# Patient Record
Sex: Male | Born: 1955 | Race: Black or African American | Hispanic: No | Smoking: Former smoker
Health system: Southern US, Community
[De-identification: ages and names within clinical notes are randomized; demographics above are authoritative.]

## PROBLEM LIST (undated history)

## (undated) DIAGNOSIS — I10 Essential (primary) hypertension: Secondary | ICD-10-CM

## (undated) DIAGNOSIS — B1920 Unspecified viral hepatitis C without hepatic coma: Secondary | ICD-10-CM

## (undated) DIAGNOSIS — F1910 Other psychoactive substance abuse, uncomplicated: Secondary | ICD-10-CM

## (undated) HISTORY — PX: OTHER SURGICAL HISTORY: SHX169

## (undated) HISTORY — DX: Unspecified viral hepatitis C without hepatic coma: B19.20

---

## 2010-07-24 ENCOUNTER — Emergency Department (HOSPITAL_BASED_OUTPATIENT_CLINIC_OR_DEPARTMENT_OTHER): Admission: EM | Admit: 2010-07-24 | Discharge: 2010-07-24 | Payer: Self-pay | Admitting: Emergency Medicine

## 2010-11-26 LAB — POCT TOXICOLOGY PANEL

## 2010-11-26 LAB — ETHANOL: Alcohol, Ethyl (B): <10 mg/dL (ref 0–10)

## 2014-07-04 ENCOUNTER — Emergency Department (HOSPITAL_BASED_OUTPATIENT_CLINIC_OR_DEPARTMENT_OTHER)
Admission: EM | Admit: 2014-07-04 | Discharge: 2014-07-04 | Disposition: A | Discharge status: Home / Self Care | Payer: Self-pay | Attending: Emergency Medicine | Admitting: Emergency Medicine

## 2014-07-04 ENCOUNTER — Encounter (HOSPITAL_BASED_OUTPATIENT_CLINIC_OR_DEPARTMENT_OTHER): Payer: Self-pay | Admitting: Emergency Medicine

## 2014-07-04 DIAGNOSIS — S0006XA Insect bite (nonvenomous) of scalp, initial encounter: Secondary | ICD-10-CM | POA: Insufficient documentation

## 2014-07-04 DIAGNOSIS — Y939 Activity, unspecified: Secondary | ICD-10-CM | POA: Insufficient documentation

## 2014-07-04 DIAGNOSIS — Y9229 Other specified public building as the place of occurrence of the external cause: Secondary | ICD-10-CM | POA: Insufficient documentation

## 2014-07-04 DIAGNOSIS — S60561A Insect bite (nonvenomous) of right hand, initial encounter: Secondary | ICD-10-CM | POA: Insufficient documentation

## 2014-07-04 DIAGNOSIS — S1096XA Insect bite of unspecified part of neck, initial encounter: Secondary | ICD-10-CM | POA: Insufficient documentation

## 2014-07-04 DIAGNOSIS — R21 Rash and other nonspecific skin eruption: Secondary | ICD-10-CM

## 2014-07-04 DIAGNOSIS — S40862A Insect bite (nonvenomous) of left upper arm, initial encounter: Secondary | ICD-10-CM | POA: Insufficient documentation

## 2014-07-04 DIAGNOSIS — S60562A Insect bite (nonvenomous) of left hand, initial encounter: Secondary | ICD-10-CM | POA: Insufficient documentation

## 2014-07-04 DIAGNOSIS — W57XXXA Bitten or stung by nonvenomous insect and other nonvenomous arthropods, initial encounter: Secondary | ICD-10-CM | POA: Insufficient documentation

## 2014-07-04 DIAGNOSIS — S40861A Insect bite (nonvenomous) of right upper arm, initial encounter: Secondary | ICD-10-CM | POA: Insufficient documentation

## 2014-07-04 MED ORDER — HYDROXYZINE HCL 25 MG PO TABS: 25.0000 mg | ORAL_TABLET | Freq: Four times a day (QID) | ORAL | Status: DC

## 2014-07-04 MED ORDER — PERMETHRIN 5 % EX CREA: TOPICAL_CREAM | CUTANEOUS | Status: DC

## 2014-07-04 NOTE — Discharge Instructions (Signed)
Bedbugs °Bedbugs are tiny bugs that live in and around beds. During the day, they hide in mattresses and other places near beds. They come out at night and bite people lying in bed. They need blood to live and grow. Bedbugs can be found in beds anywhere. Usually, they are found in places where many people come and go (hotels, shelters, hospitals). It does not matter whether the place is dirty or clean. °Getting bitten by bedbugs rarely causes a medical problem. The biggest problem can be getting rid of them.  This often takes the work of a pest control expert. °CAUSES °· Less use of pesticides. Bedbugs were common before the 1950s. Then, strong pesticides such as DDT nearly wiped them out. Today, these pesticides are not used because they harm the environment and can cause health problems. °· More travel. Besides mattresses, bedbugs can also live in clothing and luggage. They can come along as people travel from place to place. Bedbugs are more common in certain parts of the world. When people travel to those areas, the bugs can come home with them. °· Presence of birds and bats. Bedbugs often infest birds and bats. If you have these animals in or near your home, bedbugs may infest your house, too. °SYMPTOMS °It does not hurt to be bitten by a bedbug. You will probably not wake up when you are bitten. Bedbugs usually bite areas of the skin that are not covered. Symptoms may show when you wake up, or they may take a day or more to show up. Symptoms may include: °· Small red bumps on the skin. These might be lined up in a row or clustered in a group. °· A darker red dot in the middle of red bumps. °· Blisters on the skin. There may be swelling and very bad itching. These may be signs of an allergic reaction. This does not happen often. °DIAGNOSIS °Bedbug bites might look and feel like other types of insect bites. The bugs do not stay on the body like ticks or lice. They bite, drop off, and crawl away to hide. Your  caregiver will probably: °· Ask about your symptoms. °· Ask about your recent activities and travel. °· Check your skin for bedbug bites. °· Ask you to check at home for signs of bedbugs. You should look for: °¨ Spots or stains on the bed or nearby. This could be from bedbugs that were crushed or from their eggs or waste. °¨ Bedbugs themselves. They are reddish-brown, oval, and flat. They do not fly. They are about the size of an apple seed. °· Places to look for bedbugs include: °¨ Beds. Check mattresses, headboards, box springs, and bed frames. °¨ On drapes and curtains near the bed. °¨ Under carpeting in the bedroom. °¨ Behind electrical outlets. °¨ Behind any wallpaper that is peeling. °¨ Inside luggage. °TREATMENT °Most bedbug bites do not need treatment. They usually go away on their own in a few days. The bites are not dangerous. However, treatment may be needed if you have scratched so much that your skin has become infected. You may also need treatment if you are allergic to bedbug bites. Treatment options include: °· A drug that stops swelling and itching (corticosteroid). Usually, a cream is rubbed on the skin. If you have a bad rash, you may be given a corticosteroid pill. °· Oral antihistamines. These are pills to help control itching. °· Antibiotic medicines. An antibiotic may be prescribed for infected skin. °HOME CARE INSTRUCTIONS  °·   Take any medicine prescribed by your caregiver for your bites. Follow the directions carefully.  Consider wearing pajamas with long sleeves and pant legs.  Your bedroom may need to be treated. A pest control expert should make sure the bedbugs are gone. You may need to throw away mattresses or luggage. Ask the pest control expert what you can do to keep the bedbugs from coming back. Common suggestions include:  Putting a plastic cover over your mattress.  Washing and drying your clothes and bedding in hot water and a hot dryer. The temperature should be hotter  than 120 F (48.9 C). Bedbugs are killed by high temperatures.  Vacuuming carefully all around your bed. Vacuum in all cracks and crevices where the bugs might hide. Do this often.  Carefully checking all used furniture, bedding, or clothes that you bring into your house.  Eliminating bird nests and bat roosts.  If you get bedbug bites when traveling, check all your possessions carefully before bringing them into your house. If you find any bugs on clothes or in your luggage, consider throwing those items away. SEEK MEDICAL CARE IF:  You have red bug bites that keep coming back.  You have red bug bites that itch badly.  You have bug bites that cause a skin rash.  You have scratch marks that are red and sore. SEEK IMMEDIATE MEDICAL CARE IF: You have a fever. Document Released: 10/04/2010 Document Revised: 11/24/2011 Document Reviewed: 10/04/2010 [facility] Patient Information 2015 [employer], [place]. This information is not intended to replace advice given to you by your health care provider. Make sure you discuss any questions you have with your health care provider.   Emergency Department Resource Guide 1) Find a Doctor and Pay Out of Pocket Although you won't have to find out who is covered by your insurance plan, it is a good idea to ask around and get recommendations. You will then need to call the office and see if the doctor you have chosen will accept you as a new patient and what types of options they offer for patients who are self-pay. Some doctors offer discounts or will set up payment plans for their patients who do not have insurance, but you will need to ask so you aren't surprised when you get to your appointment.  2) Contact Your Local Health Department Not all health departments have doctors that can see patients for sick visits, but many do, so it is worth a call to see if yours does. If you don't know where your local health department is, you can check in your phone book.  The CDC also has a tool to help you locate your state's health department, and many state websites also have listings of all of their local health departments.  3) Find a Walk-in Clinic If your illness is not likely to be very severe or complicated, you may want to try a walk in clinic. These are popping up all over the country in pharmacies, drugstores, and shopping centers. They're usually staffed by nurse practitioners or physician assistants that have been trained to treat common illnesses and complaints. They're usually fairly quick and inexpensive. However, if you have serious medical issues or chronic medical problems, these are probably not your best option.  No Primary Care Doctor: - Call Health Connect at  [phone] - they can help you locate a primary care doctor that  accepts your insurance, provides certain services, etc. - Physician Referral Service- [phone]  Chronic Pain Problems: Organization  Address  Phone   Notes  [facility]  [phone] Patients need to be referred by their primary care doctor.   Medication Assistance: Organization         Address  Phone   Notes  [facility] Medication [facility] [address] [place], [place] [address] [phone] --Must be a resident of [facility] -- Must have NO insurance coverage whatsoever (no Medicaid/ Medicare, etc.) -- The pt. MUST have a primary care doctor that directs their care regularly and follows them in the community   MedAssist  [phone]) [phone]   [employer]  [phone]    Agencies that provide inexpensive medical care: Organization         Address  Phone   Notes  [facility]  [phone]   [facility]    [phone]   [facility] [address] [place], [place] [address] [phone]   [facility] [address], [place] [phone]   [facility]     [phone]) [phone]   [facility]    [phone]   [facility] and [facility]  [address], [place] Phone:  [phone], Fax:  [phone] Hours of Operation:  9 am - 6 pm, M-F.  Also accepts Medicaid/Medicare and self-pay.  [facility]  [address], [place] Phone: [phone]) [phone], Fax: [phone]. Hours of Operation:  8:30 am - 5:30 pm, M-F.  Also accepts Medicaid and self-pay.  [facility] [place] [address], [place] Phone: [phone]) [phone]   [facility] [address], [place], [place] [phone], Ext. [phone] Mondays & Thursdays: 7-9 AM.  First 15 patients are seen on a first come, first serve basis.    Medicaid-accepting [facility] [place] Providers:  Organization         Address  Phone   Notes  [facility] [address], [place] [phone] Also accepts self-pay patients.  [facility] [address], [place]  [phone]) [phone]   [facility] [address], [place] [phone]   [facility] [address] [place] [address], [place] [phone]   Renaye RakersVeita Bland [address], [place]   [phone] Only accepts [place] Access [place] patients after they have their name applied to their card.   Self-Pay (no insurance) in [facility]:  Organization         Address  Phone   Notes  Sickle Cell Patients, [facility] [address], [place] [phone]) [phone]   [facility] [address], [place] [phone]   [facility] [place]  [address], [place] [phone]) [phone]   [facility]/Dr. Osei-Bonsu  [address] [place] [address], [place] or [phone], [place] [phone] Phone number for both [place] and [place] locations is the same.  [facility] and  [facility] [address], [place] [phone]   [facility] [place] [address] [place] [address], [place] or [address] [phone]   [facility] [address], [place] [phone], phone; [phone], fax Sees patients 1st and 3rd Saturday of every month.  Must not qualify  for public or private insurance (i.e. Medicaid, Medicare, [place] Health Choice, Veterans' Benefits)  Household income should be no more than 200% of the poverty level The clinic cannot treat you if you are pregnant or think you are pregnant  Sexually transmitted diseases are not treated at the clinic.    Dental Care: Organization         Address  Phone  Notes  [facility] of [facility] [address] [place], [place] [phone] Accepts children up to age 35 who are enrolled in [place] or [place] Health Choice; pregnant women with a Medicaid card; and children who have applied for Medicaid or [place] Health Choice, but were declined, whose parents can pay a reduced fee at time of service.  [facility] Department of [facility]  [address], [place] [phone] Accepts children up to age 74 who are enrolled in [place] or [place] Health Choice; pregnant women with a Medicaid card; and children who have applied for Medicaid or [place] Health Choice, but were declined, whose parents can pay a reduced fee at time of service.  [facility]  [address] [place], [place] [phone] Patients are seen by appointment only. Walk-ins are not accepted. [facility] will see patients 36 years of age and older. Monday - Tuesday (8am-5pm) Most Wednesdays (8:30-5pm) $30 per visit, cash only  [facility] Access PRO[facility]  [address], [facility] [phone] Patients are seen by appointment only. Walk-ins are not accepted. [facility] will see patients 31 years of age and  older. One Wednesday Evening (Monthly: Volunteer Based).  $30 per visit, cash only  [employer]  [phone] for adults; Children under age 38, call Graduate Pediatric Dentistry at [phone]. Children aged 87-14, please call [phone] to request a pediatric application.  Dental services are provided in all areas of dental care including fillings, crowns and bridges, complete and partial dentures, implants, gum treatment, root canals, and extractions. Preventive care is also provided. Treatment is provided to both adults and children. Patients are selected via a lottery and there is often a waiting list.   [facility] [address], [place]  [phone] [email or web address]   [facility] [address] [place], [place] [phone], Ext. [phone] Second and Fourth Thursday of each month, opens at 6:30 AM; Clinic ends at 9 AM.  Patients are seen on a first-come first-served basis, and a limited number are seen during each clinic.   [facility]  [address] [place] [address] [place], [place] [phone]   Eligibility Requirements You must have lived in [place], [place], or [place] counties for at least the last three months.   You cannot be eligible for state or federal sponsored [place], including [employer], [place], or [employer].   You generally cannot be eligible for healthcare insurance through your employer.    How to apply: Eligibility screenings are held every Tuesday and Wednesday afternoon from 1:00 pm until 4:00 pm. You do not need an appointment for the interview!  [facility] [address] [place], [place] [phone]   [facility] Health Department  [phone]   [facility] Health Department  [phone]   [facility] Health Department  [phone]    Behavioral Health Resources in the Community: Intensive Outpatient Programs Organization          Address  Phone  Notes  [facility] [address] [place], [place] [phone]   [facility]patient [address] [place], [place] [phone]   [facility]: [facility] [address] [place], [place]  [phone]   [facility] Mental Health [address]  [place], [place] [phone] or [phone]   Substance Abuse Resources Organization         Address  Phone  Notes  Alcohol and Drug Services  [phone]   [facility]  [phone]   [facility] [place]  [phone]   Floydene Flock  [phone]   Residential & Outpatient Substance Abuse Program  [phone]   Psychological Services Organization         Address  Phone  Notes  [facility] Behavioral Health  [phone]   [facility]  [phone]   [facility] [address], [place] [phone] or [phone]    Mobile Crisis Teams Organization         Address  Phone  Notes  [facility], Mobile Crisis Care Unit  [phone]   [facility]  [address] [place], [place] [phone]   Doristine Locks [address] [place] [phone]    Self-Help/Support Groups Organization         Address  Phone             Notes  [facility] [place] - variety of support groups  [phone]- [phone] Call for more information  Narcotics Anonymous (NA), [facility] [address], [employer] [place]  2 meetings at this location   Statistician         Address  Phone  Notes  [facility] [address],    [place]  [phone]   [facility]  [address] [place] [address]242, [place], [place] [phone]   [facility] [address] [place], [place] [phone] Admissions: 8am-3pm M-F  [facility] [address]    [place], [place] [phone]   [facility] [address] [place], [place], [place] [phone]   [facility] [address],  [place], [place] [phone]   [facility] - Intensive Outpatient [address], [place], [place] [phone]   [facility] [address] [place]  [place], [place] [phone] or [phone]   [facility] ([facility]) [address], [place], [place] [phone] Accepts Medicaid  [facility] [place] [address]  [place] [phone] Substance Abuse/Addiction Treatment   [facility] Organization         Address  Phone  Notes  [facility]  [phone]   Angie Fava, PhD [address] [place], [place]   [phone] or [phone]   [facility] Behavioral   [address] [place], [place] [phone]   [facility] [address] [place], [place] [phone] Insurance/Medicaid/sponsorship through [facility] and Families [address]                                    [place], [place] [phone] Therapy/tele-psych/case  [facility] [address], [place] [phone]    Dr. Lolly Mustache  [phone]   [facility]. 1) [address], [place] 2) [place] 3)  [place] [address], [place] [phone]  [phone]   [place] [phone] or [phone] (After Hours)

## 2014-07-04 NOTE — ED Provider Notes (Signed)
CSN: [id number]     Arrival date & time 07/04/14  1411 History   First MD Initiated Contact with Patient 07/04/14 1501     Chief Complaint  Patient presents with  . Rash     (Consider location/radiation/quality/duration/timing/severity/associated sxs/prior Treatment) Patient is a 58 y.o. male presenting with rash. The history is provided by the patient.  Rash Location:  Shoulder/arm, head/neck, torso and ano-genital Head/neck rash location:  L neck, R neck and scalp Shoulder/arm rash location:  L upper arm, R upper arm, R arm, L arm, L forearm, R forearm, R hand and L hand Torso rash location:  Upper back and lower back Ano-genital rash location:  Groin, L buttock and R buttock Quality: itchiness   Quality: not bruising, not burning, not dry, not painful, not scaling, not swelling and not weeping   Severity:  Moderate Onset quality:  Gradual Duration:  3 days Timing:  Constant Progression:  Unchanged Chronicity:  New Context: insect bite/sting (found bedbugs at a hotel where he stayed)   Relieved by:  Nothing Worsened by:  Nothing tried Associated symptoms: no abdominal pain, no diarrhea, no fatigue, no fever, no nausea, no shortness of breath and not vomiting     History reviewed. No pertinent past medical history. History reviewed. No pertinent past surgical history. History reviewed. No pertinent family history. History  Substance Use Topics  . Smoking status: Never Smoker   . Smokeless tobacco: Not on file  . Alcohol Use: No    Review of Systems  Constitutional: Negative for fever, chills and fatigue.  Respiratory: Negative for cough and shortness of breath.   Gastrointestinal: Negative for nausea, vomiting, abdominal pain and diarrhea.  Skin: Positive for rash.  All other systems reviewed and are negative.     Allergies  Review of patient's allergies indicates no known allergies.  Home Medications   Prior to Admission medications   Not on File   BP  153/88  Pulse 80  Temp(Src) 98.2 F (36.8 C) (Oral)  Resp 16  Ht 5\' 10"  (1.778 m)  Wt 215 lb (97.523 kg)  BMI 30.85 kg/m2  SpO2 100% Physical Exam  Constitutional: He is oriented to person, place, and time. He appears well-developed and well-nourished. No distress.  HENT:  Head: Normocephalic and atraumatic.  Mouth/Throat: No oropharyngeal exudate.  Eyes: EOM are normal. Pupils are equal, round, and reactive to light.  Neck: Normal range of motion. Neck supple.  Cardiovascular: Normal rate and regular rhythm.  Exam reveals no friction rub.   No murmur heard. Pulmonary/Chest: Effort normal and breath sounds normal. No respiratory distress. He has no wheezes. He has no rales.  Abdominal: He exhibits no distension. There is no tenderness. There is no rebound.  Musculoskeletal: Normal range of motion. He exhibits no edema.  Neurological: He is alert and oriented to person, place, and time.  Skin: Rash (multiple lesions on arms, hands, neck, on fingers. Red, no papules. Many of these lesions are excoriated and scabbed over. No signs of cellulitis or purpura. No petechiae.) noted. He is not diaphoretic.    ED Course  Procedures (including critical care time) Labs Review Labs Reviewed - No data to display  Imaging Review No results found.   EKG Interpretation None      MDM   Final diagnoses:  Bedbug bite  Rash    68M here with rash. States it's bed bugs, took a picture with his phone at a hotel where he stayed 3 days ago. Rash pruritic.  No cellulitis, no purpura or petechiae. Denies systemic symptoms. AFVSS here. Rash on forearms, a few lesions on hands - red, no major papules. Patient absolutely sure it's from bedbugs. Will give atarax for itching and will give permethrin to treat for possible scabies.    Elwin MochaBlair Rushi Chasen, MD 07/04/14 [phone]

## 2014-07-04 NOTE — ED Notes (Signed)
Pt c/o rash to entire body " bed bugs" x 3 days

## 2015-09-24 ENCOUNTER — Emergency Department (HOSPITAL_BASED_OUTPATIENT_CLINIC_OR_DEPARTMENT_OTHER)
Admission: EM | Admit: 2015-09-24 | Discharge: 2015-09-24 | Disposition: A | Discharge status: Home / Self Care | Payer: No Typology Code available for payment source | Attending: Emergency Medicine | Admitting: Emergency Medicine

## 2015-09-24 ENCOUNTER — Encounter (HOSPITAL_BASED_OUTPATIENT_CLINIC_OR_DEPARTMENT_OTHER): Payer: Self-pay | Admitting: Emergency Medicine

## 2015-09-24 ENCOUNTER — Emergency Department (HOSPITAL_BASED_OUTPATIENT_CLINIC_OR_DEPARTMENT_OTHER): Payer: No Typology Code available for payment source

## 2015-09-24 DIAGNOSIS — S29001A Unspecified injury of muscle and tendon of front wall of thorax, initial encounter: Secondary | ICD-10-CM | POA: Insufficient documentation

## 2015-09-24 DIAGNOSIS — S0990XA Unspecified injury of head, initial encounter: Secondary | ICD-10-CM | POA: Diagnosis not present

## 2015-09-24 DIAGNOSIS — Y92410 Unspecified street and highway as the place of occurrence of the external cause: Secondary | ICD-10-CM | POA: Insufficient documentation

## 2015-09-24 DIAGNOSIS — Y9389 Activity, other specified: Secondary | ICD-10-CM | POA: Insufficient documentation

## 2015-09-24 DIAGNOSIS — S81802A Unspecified open wound, left lower leg, initial encounter: Secondary | ICD-10-CM | POA: Diagnosis not present

## 2015-09-24 DIAGNOSIS — Y998 Other external cause status: Secondary | ICD-10-CM | POA: Insufficient documentation

## 2015-09-24 DIAGNOSIS — M79662 Pain in left lower leg: Secondary | ICD-10-CM

## 2015-09-24 DIAGNOSIS — T148XXA Other injury of unspecified body region, initial encounter: Secondary | ICD-10-CM

## 2015-09-24 DIAGNOSIS — R0781 Pleurodynia: Secondary | ICD-10-CM

## 2015-09-24 MED ORDER — BACITRACIN ZINC 500 UNIT/GM EX OINT
TOPICAL_OINTMENT | Freq: Two times a day (BID) | CUTANEOUS | Status: DC
  Filled 2015-09-24: qty 28.35

## 2015-09-24 MED ORDER — IBUPROFEN 800 MG PO TABS: 800.0000 mg | ORAL_TABLET | Freq: Three times a day (TID) | ORAL | Status: DC

## 2015-09-24 MED ORDER — IBUPROFEN 800 MG PO TABS
800.0000 mg | ORAL_TABLET | Freq: Once | ORAL | Status: AC
  Administered 2015-09-24: 800 mg via ORAL
  Filled 2015-09-24: qty 1
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
## 2015-09-24 NOTE — ED Notes (Signed)
Pt refused to have wound cleaned and dressed. Sts he is in a hurry and frustrated and he will do it at home.
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
## 2019-04-19 ENCOUNTER — Other Ambulatory Visit: Payer: Self-pay

## 2019-04-19 ENCOUNTER — Emergency Department (HOSPITAL_BASED_OUTPATIENT_CLINIC_OR_DEPARTMENT_OTHER): Payer: BLUE CROSS/BLUE SHIELD

## 2019-04-19 ENCOUNTER — Emergency Department (HOSPITAL_BASED_OUTPATIENT_CLINIC_OR_DEPARTMENT_OTHER)
Admission: EM | Admit: 2019-04-19 | Discharge: 2019-04-19 | Disposition: A | Discharge status: Home / Self Care | Payer: BLUE CROSS/BLUE SHIELD | Attending: Emergency Medicine | Admitting: Emergency Medicine

## 2019-04-19 ENCOUNTER — Encounter (HOSPITAL_BASED_OUTPATIENT_CLINIC_OR_DEPARTMENT_OTHER): Payer: Self-pay | Admitting: Emergency Medicine

## 2019-04-19 DIAGNOSIS — Z79899 Other long term (current) drug therapy: Secondary | ICD-10-CM | POA: Diagnosis not present

## 2019-04-19 DIAGNOSIS — I10 Essential (primary) hypertension: Secondary | ICD-10-CM | POA: Diagnosis not present

## 2019-04-19 DIAGNOSIS — R0789 Other chest pain: Secondary | ICD-10-CM | POA: Insufficient documentation

## 2019-04-19 DIAGNOSIS — R079 Chest pain, unspecified: Secondary | ICD-10-CM | POA: Diagnosis present

## 2019-04-19 HISTORY — DX: Other psychoactive substance abuse, uncomplicated: F19.10

## 2019-04-19 HISTORY — DX: Essential (primary) hypertension: I10

## 2019-04-19 LAB — BASIC METABOLIC PANEL
Anion gap: 8 (ref 5–15)
BUN: 12 mg/dL (ref 6–20)
CO2: 26 mmol/L (ref 22–32)
Calcium: 8.7 mg/dL — ABNORMAL LOW (ref 8.9–10.3)
Chloride: 102 mmol/L (ref 98–111)
Creatinine, Ser: 0.85 mg/dL (ref 0.61–1.24)
GFR calc Af Amer: >60 mL/min (ref >60)
GFR calc non Af Amer: >60 mL/min (ref >60)
Glucose, Bld: 108 mg/dL — ABNORMAL HIGH (ref 70–99)
Potassium: 4.1 mmol/L (ref 3.5–5.1)
Sodium: 136 mmol/L (ref 135–145)

## 2019-04-19 LAB — CBC WITH DIFFERENTIAL/PLATELET
Abs Immature Granulocytes: 0.02 10*3/uL (ref 0.00–0.07)
Basophils Absolute: 0 10*3/uL (ref 0.0–0.1)
Basophils Relative: 0 %
Eosinophils Absolute: 0.3 10*3/uL (ref 0.0–0.5)
Eosinophils Relative: 4 %
HCT: 36.4 % — ABNORMAL LOW (ref 39.0–52.0)
Hemoglobin: 11.5 g/dL — ABNORMAL LOW (ref 13.0–17.0)
Immature Granulocytes: 0 %
Lymphocytes Relative: 32 %
Lymphs Abs: 2 10*3/uL (ref 0.7–4.0)
MCH: 27.5 pg (ref 26.0–34.0)
MCHC: 31.6 g/dL (ref 30.0–36.0)
MCV: 87.1 fL (ref 80.0–100.0)
Monocytes Absolute: 0.6 10*3/uL (ref 0.1–1.0)
Monocytes Relative: 9 %
Neutro Abs: 3.4 10*3/uL (ref 1.7–7.7)
Neutrophils Relative %: 55 %
Platelets: 215 10*3/uL (ref 150–400)
RBC: 4.18 MIL/uL — ABNORMAL LOW (ref 4.22–5.81)
RDW: 13.3 % (ref 11.5–15.5)
WBC: 6.3 10*3/uL (ref 4.0–10.5)
nRBC: 0 % (ref 0.0–0.2)

## 2019-04-19 LAB — TROPONIN I (HIGH SENSITIVITY): Troponin I (High Sensitivity): <2 ng/L (ref <18)

## 2019-04-19 NOTE — ED Triage Notes (Addendum)
"   I was having a little sharp pain in my chest about a hr ago" Denies chest pain or SOB at present

## 2019-04-19 NOTE — Discharge Instructions (Signed)
Please return for worsening symptoms, symptoms that happen when you are exerting yourself.  Please follow-up with the family physician.

## 2019-04-19 NOTE — ED Provider Notes (Signed)
[facility] EMERGENCY DEPARTMENT Provider Note   CSN: [id number] Arrival date & time: 04/19/19  0844    History   Chief Complaint Chief Complaint  Patient presents with  . Chest Pain    HPI Rory PercyJoseph Frye is a 63 y.o. male.     63 yo M with a chief complaint of left-sided chest pain.  This was sharp.  Pinpoint.  Lasted for less than a second.  Occurred while he was at work.  Nonexertional.  No shortness of breath no diaphoresis no nausea or vomiting.  No history of MI.  History of hypertension that improved with diet and exercise.  Denies hyperlipidemia diabetes smoking or family history of MI.  No history of PE or DVT no unilateral lower extremity edema no hemoptysis no recent surgery immobilization or travel.  The history is provided by the patient.  Chest Pain Pain location:  L chest Pain quality: sharp   Pain radiates to:  Does not radiate Pain severity:  Moderate Onset quality:  Sudden Duration: seconds. Timing:  Rare Progression:  Resolved Chronicity:  New Relieved by:  Nothing Worsened by:  Nothing Ineffective treatments:  None tried Associated symptoms: no abdominal pain, no fever, no headache, no palpitations, no shortness of breath and no vomiting     Past Medical History:  Diagnosis Date  . Drug abuse (HCC)   . Hypertension     There are no active problems to display for this patient.   History reviewed. No pertinent surgical history.      Home Medications    Prior to Admission medications   Medication Sig Start Date End Date Taking? Authorizing Provider  METHADONE HCL PO Take 40 mg by mouth 1 day or 1 dose.   Yes [provider]  hydrOXYzine (ATARAX/VISTARIL) 25 MG tablet Take 1 tablet (25 mg total) by mouth every 6 (six) hours. 07/04/14   Elwin MochaWalden, Blair, MD  ibuprofen (ADVIL,MOTRIN) 800 MG tablet Take 1 tablet (800 mg total) by mouth 3 (three) times daily. 09/24/15   Cheri Fowlerose, Kayla, PA-C  permethrin (ELIMITE) 5 % cream Apply to  affected area once, rinse off after 6 hours. Repeat in 5-7 days if necessary 07/04/14   Elwin MochaWalden, Blair, MD    Family History No family history on file.  Social History Social History   Tobacco Use  . Smoking status: Never Smoker  . Smokeless tobacco: Never Used  Substance Use Topics  . Alcohol use: No  . Drug use: Yes    Comment: Opioids      Allergies   Patient has no known allergies.   Review of Systems Review of Systems  Constitutional: Negative for chills and fever.  HENT: Negative for congestion and facial swelling.   Eyes: Negative for discharge and visual disturbance.  Respiratory: Negative for shortness of breath.   Cardiovascular: Positive for chest pain. Negative for palpitations.  Gastrointestinal: Negative for abdominal pain, diarrhea and vomiting.  Musculoskeletal: Negative for arthralgias and myalgias.  Skin: Negative for color change and rash.  Neurological: Negative for tremors, syncope and headaches.  Psychiatric/Behavioral: Negative for confusion and dysphoric mood.     Physical Exam Updated Vital Signs BP 133/81   Pulse (!) 59   Temp 98 F (36.7 C) (Oral)   Resp 11   Ht 5\' 11"  (1.803 m)   Wt 96 kg   SpO2 99%   BMI 29.51 kg/m   Physical Exam Vitals signs and nursing note reviewed.  Constitutional:      Appearance:  He is well-developed.  HENT:     Head: Normocephalic and atraumatic.  Eyes:     Pupils: Pupils are equal, round, and reactive to light.  Neck:     Musculoskeletal: Normal range of motion and neck supple.     Vascular: No JVD.  Cardiovascular:     Rate and Rhythm: Normal rate and regular rhythm.     Heart sounds: No murmur. No friction rub. No gallop.   Pulmonary:     Effort: No respiratory distress.     Breath sounds: No wheezing.  Chest:     Chest wall: No mass or tenderness.  Abdominal:     General: There is no distension.     Tenderness: There is no guarding or rebound.  Musculoskeletal: Normal range of motion.   Skin:    Coloration: Skin is not pale.     Findings: No rash.     Comments: Loss of hair to lower legs   Neurological:     Mental Status: He is alert and oriented to person, place, and time.  Psychiatric:        Behavior: Behavior normal.      ED Treatments / Results  Labs (all labs ordered are listed, but only abnormal results are displayed) Labs Reviewed  CBC WITH DIFFERENTIAL/PLATELET - Abnormal; Notable for the following components:      Result Value   RBC 4.18 (*)    Hemoglobin 11.5 (*)    HCT 36.4 (*)    All other components within normal limits  BASIC METABOLIC PANEL - Abnormal; Notable for the following components:   Glucose, Bld 108 (*)    Calcium 8.7 (*)    All other components within normal limits  TROPONIN I (HIGH SENSITIVITY)  TROPONIN I (HIGH SENSITIVITY)    EKG EKG Interpretation  Date/Time:  Tuesday April 19 2019 08:53:41 EDT Ventricular Rate:  77 PR Interval:    QRS Duration: 84 QT Interval:  383 QTC Calculation: 434 R Axis:   52 Text Interpretation:  Sinus rhythm Abnormal R-wave progression, early transition No old tracing to compare Confirmed by Deno Etienne [phone]) on 04/19/2019 9:55:50 AM   Radiology Dg Chest 2 View  Result Date: 04/19/2019 CLINICAL DATA:  63 year old male with a history of chest pain EXAM: CHEST - 2 VIEW COMPARISON:  December 29, 2014 FINDINGS: Cardiomediastinal silhouette unchanged in size and contour. No evidence of central vascular congestion. No interlobular septal thickening. Coarsened interstitial markings similar to prior. No pneumothorax or pleural effusion. No confluent airspace disease. No displaced fracture. IMPRESSION: Chronic lung changes without evidence of acute cardiopulmonary disease Electronically Signed   By: Corrie Mckusick D.O.   On: 04/19/2019 09:21    Procedures Procedures (including critical care time)  Medications Ordered in ED Medications - No data to display   Initial Impression / Assessment and Plan /  ED Course  I have reviewed the triage vital signs and the nursing notes.  Pertinent labs & imaging results that were available during my care of the patient were reviewed by me and considered in my medical decision making (see chart for details).        63 yo M with a chief complaints of left-sided chest pain.  Atypical of ACS.  Lasted seconds pinpoint resolved.  Nonexertional.  No cardiac risk factors.  Discussed obtaining a delta troponin with the patient.  He is currently refusing.  Would prefer to have a single test.  Discussed limitations of this.  He will follow-up with  his family doctor.  10:34 AM:  I have discussed the diagnosis/risks/treatment options with the patient and family and believe the pt to be eligible for discharge home to follow-up with PCP. We also discussed returning to the ED immediately if new or worsening sx occur. We discussed the sx which are most concerning (e.g., sudden worsening pain, fever, inability to tolerate by mouth) that necessitate immediate return. Medications administered to the patient during their visit and any new prescriptions provided to the patient are listed below.  Medications given during this visit Medications - No data to display   The patient appears reasonably screen and/or stabilized for discharge and I doubt any other medical condition or other [facility] requiring further screening, evaluation, or treatment in the ED at this time prior to discharge.    Final Clinical Impressions(s) / ED Diagnoses   Final diagnoses:  Atypical chest pain    ED Discharge Orders    None       Melene PlanFloyd, Myrian Botello, DO 04/19/19 1034

## 2019-04-19 NOTE — ED Notes (Signed)
Patient transported to X-ray 

## 2019-04-19 NOTE — ED Notes (Signed)
ED Provider at bedside. 

## 2022-01-20 ENCOUNTER — Encounter (HOSPITAL_BASED_OUTPATIENT_CLINIC_OR_DEPARTMENT_OTHER): Payer: Self-pay

## 2022-01-20 ENCOUNTER — Emergency Department (HOSPITAL_BASED_OUTPATIENT_CLINIC_OR_DEPARTMENT_OTHER)
Admission: EM | Admit: 2022-01-20 | Discharge: 2022-01-20 | Disposition: A | Discharge status: Home / Self Care | Payer: BLUE CROSS/BLUE SHIELD | Attending: Emergency Medicine | Admitting: Emergency Medicine

## 2022-01-20 ENCOUNTER — Other Ambulatory Visit: Payer: Self-pay

## 2022-01-20 ENCOUNTER — Emergency Department (HOSPITAL_BASED_OUTPATIENT_CLINIC_OR_DEPARTMENT_OTHER): Payer: BLUE CROSS/BLUE SHIELD

## 2022-01-20 DIAGNOSIS — S161XXA Strain of muscle, fascia and tendon at neck level, initial encounter: Secondary | ICD-10-CM | POA: Insufficient documentation

## 2022-01-20 DIAGNOSIS — S199XXA Unspecified injury of neck, initial encounter: Secondary | ICD-10-CM | POA: Diagnosis present

## 2022-01-20 DIAGNOSIS — X500XXA Overexertion from strenuous movement or load, initial encounter: Secondary | ICD-10-CM | POA: Insufficient documentation

## 2022-01-20 MED ORDER — IBUPROFEN 800 MG PO TABS
800.0000 mg | ORAL_TABLET | Freq: Four times a day (QID) | ORAL | 0 refills | Status: AC | PRN
Start: 2022-01-20 — End: ?

## 2022-01-20 MED ORDER — DEXAMETHASONE SODIUM PHOSPHATE 10 MG/ML IJ SOLN
10.0000 mg | Freq: Once | INTRAMUSCULAR | Status: AC
  Administered 2022-01-20: 10 mg via INTRAMUSCULAR
  Filled 2022-01-20: qty 1

## 2022-01-20 MED ORDER — KETOROLAC TROMETHAMINE 30 MG/ML IJ SOLN
30.0000 mg | Freq: Once | INTRAMUSCULAR | Status: AC
  Administered 2022-01-20: 30 mg via INTRAMUSCULAR
  Filled 2022-01-20: qty 1

## 2022-01-20 MED ORDER — METHOCARBAMOL 500 MG PO TABS: 500.0000 mg | ORAL_TABLET | Freq: Three times a day (TID) | ORAL | 0 refills | Status: AC | PRN

## 2022-01-20 NOTE — ED Triage Notes (Signed)
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 ?

## 2022-01-20 NOTE — ED Provider Notes (Signed)
?[facility] EMERGENCY DEPARTMENT ?Provider Note ? ? ?CSN: [id number] ?Arrival date & time: 01/20/22  0344 ? ?  ? ?History ? ?Chief Complaint  ?Patient presents with  ? Neck Injury  ? ? ?Bradley Mcintosh is a 66 y.o. male. ? ?Patient presents to the emergency department for evaluation of neck and arm pain.  Patient reports that he picked up a bag of concrete 5 days ago and has been having pain in the neck ever since.  Pain is now radiating to the right arm.  He has not noticed any weakness of the upper extremities. ? ? ?  ? ?Home Medications ?Prior to Admission medications   ?Medication Sig Start Date End Date Taking? Authorizing Provider  ?ibuprofen (ADVIL) 800 MG tablet Take 1 tablet (800 mg total) by mouth every 6 (six) hours as needed for moderate pain. 01/20/22  Yes Trong Gosling, Canary Brim, MD  ?methocarbamol (ROBAXIN) 500 MG tablet Take 1 tablet (500 mg total) by mouth every 8 (eight) hours as needed for muscle spasms. 01/20/22  Yes Kortne All, Canary Brim, MD  ?METHADONE HCL PO Take 40 mg by mouth 1 day or 1 dose.    [provider]  ?   ? ?Allergies    ?Patient has no known allergies.   ? ?Review of Systems   ?Review of Systems ? ?Physical Exam ?Updated Vital Signs ?BP (!) 170/93 (BP Location: Left Arm)   Pulse 66   Temp 98 ?F (36.7 ?C) (Oral)   Resp 18   Ht 5\' 10"  (1.778 m)   Wt 93 kg   SpO2 98%   BMI 29.41 kg/m?  ?Physical Exam ?Vitals and nursing note reviewed.  ?Constitutional:   ?   Appearance: Normal appearance.  ?Musculoskeletal:  ?   Right shoulder: Tenderness present. Normal range of motion.  ?   Cervical back: Pain with movement and muscular tenderness present. No spinous process tenderness. Decreased range of motion.  ?   Thoracic back: Spasms and tenderness present.  ?   Lumbar back: Normal.  ?     Back: ? ?Skin: ?   Findings: No rash.  ?Neurological:  ?   Mental Status: He is alert.  ?   Cranial Nerves: Cranial nerves 2-12 are intact.  ?   Sensory: Sensation is intact.  ?    Motor: Motor function is intact.  ?   Deep Tendon Reflexes:  ?   Reflex Scores: ?     Tricep reflexes are 2+ on the right side. ?     Bicep reflexes are 2+ on the right side. ? ? ?ED Results / Procedures / Treatments   ?Labs ?(all labs ordered are listed, but only abnormal results are displayed) ?Labs Reviewed - No data to display ? ?EKG ?None ? ?Radiology ?CT Cervical Spine Wo Contrast ? ?Result Date: 01/20/2022 ?CLINICAL DATA:  Neck trauma with neuro deficit and paresthesias. EXAM: CT CERVICAL SPINE WITHOUT CONTRAST TECHNIQUE: Multidetector CT imaging of the cervical spine was performed without intravenous contrast. Multiplanar CT image reconstructions were also generated. RADIATION DOSE REDUCTION: This exam was performed according to the departmental dose-optimization program which includes automated exposure control, adjustment of the mA and/or kV according to patient size and/or use of iterative reconstruction technique. COMPARISON:  None Available. FINDINGS: Alignment: There is a reversed cervical lordosis and a 3 mm C5-6 anterolisthesis, the latter is most likely due to discogenic degenerative arthrosis. No further listhesis is seen. There is narrowing and spurring of the anterior atlantodental joint Skull  base and vertebrae: The bone mineralization is normal. No fracture or primary bone lesion is seen. Soft tissues and spinal canal: No prevertebral fluid or swelling. No visible canal hematoma. No thyroid mass is seen. There is minimal calcification in both proximal cervical ICAs. Disc levels: At C5-6 there is mild-to-moderate disc space loss and there are bidirectional osteophytes. Posteriorly, a right paracentral disc osteophyte complex impinges the right hemicord causing about 5 mm of thecal sac AP stenosis in the midline to right of midline. Other discs are normal in heights. At C3-4 there is small left paracentral disc protrusion slightly deforming the left ventral cord surface. At C6-7 there is a mild  nonstenosing right posterior disc osteophyte complex. Other levels do not show significant soft tissue or bony encroachment on the thecal sac. There is slight facet joint and trace uncinate spurring at most levels but no levels show foraminal compromise. Upper chest: Mild paraseptal emphysema both lung apices. Other: None. IMPRESSION: 1. C5-6 3 mm grade 1 anterolisthesis, most likely due to degenerative disc disease, not suspected to be traumatic listhesis. 2. C5-6 degenerative disc disease, spondylosis, and with right paracentral disc osteophyte complex compressing the right hemicord. No foraminal compromise. 3. C3-4 small left paracentral disc protrusion slightly deforming the left ventral cord surface. 4. Reversed cervical lordosis, with no evidence of fractures. 5. Emphysema. Electronically Signed   By: Almira Bar M.D.   On: 01/20/2022 04:46   ? ?Procedures ?Procedures  ? ? ?Medications Ordered in ED ?Medications  ?ketorolac (TORADOL) 30 MG/ML injection 30 mg (has no administration in time range)  ?dexamethasone (DECADRON) injection 10 mg (has no administration in time range)  ? ? ?ED Course/ Medical Decision Making/ A&P ?  ?                        ?Medical Decision Making ?Amount and/or Complexity of Data Reviewed ?Radiology: ordered. ? ? ?Patient presents to the emergency department for evaluation of neck and arm pain.  Patient complaining of pain at the base of the neck after lifting a bag of mortar 5 days ago.  Initially the pain was nonradiating, now he is experiencing pain into the right arm.  Area of maximal tenderness is right upper back paraspinal muscles.  He does have some slight midline tenderness at the base of the C-spine.  Patient has normal strength and sensation in both upper extremities.  Cervical spine with degenerative changes but no obvious acute abnormality to explain his symptoms.  Examination is more consistent with muscle injury and spasm causing peripheral radiculopathy.  As he has  no neurologic deficits, treat symptomatically and empirically, follow-up with PCP. ? ? ? ? ? ? ? ?Final Clinical Impression(s) / ED Diagnoses ?Final diagnoses:  ?Cervical strain, acute, initial encounter  ? ? ?Rx / DC Orders ?ED Discharge Orders   ? ?      Ordered  ?  ibuprofen (ADVIL) 800 MG tablet  Every 6 hours PRN       ? 01/20/22 0517  ?  methocarbamol (ROBAXIN) 500 MG tablet  Every 8 hours PRN       ? 01/20/22 0517  ? ?  ?  ? ?  ? ? ?  ?Gilda Crease, MD ?01/20/22 [phone] ? ?

## 2022-01-23 ENCOUNTER — Other Ambulatory Visit: Payer: Self-pay

## 2022-01-23 ENCOUNTER — Emergency Department (HOSPITAL_BASED_OUTPATIENT_CLINIC_OR_DEPARTMENT_OTHER)
Admission: EM | Admit: 2022-01-23 | Discharge: 2022-01-23 | Disposition: A | Discharge status: Home / Self Care | Payer: BLUE CROSS/BLUE SHIELD | Attending: Emergency Medicine | Admitting: Emergency Medicine

## 2022-01-23 ENCOUNTER — Other Ambulatory Visit (HOSPITAL_BASED_OUTPATIENT_CLINIC_OR_DEPARTMENT_OTHER): Payer: Self-pay

## 2022-01-23 ENCOUNTER — Encounter (HOSPITAL_BASED_OUTPATIENT_CLINIC_OR_DEPARTMENT_OTHER): Payer: Self-pay

## 2022-01-23 DIAGNOSIS — M25511 Pain in right shoulder: Secondary | ICD-10-CM | POA: Diagnosis not present

## 2022-01-23 DIAGNOSIS — M5412 Radiculopathy, cervical region: Secondary | ICD-10-CM | POA: Diagnosis not present

## 2022-01-23 DIAGNOSIS — M542 Cervicalgia: Secondary | ICD-10-CM | POA: Diagnosis present

## 2022-01-23 MED ORDER — PREDNISONE 20 MG PO TABS
ORAL_TABLET | ORAL | 0 refills | Status: AC
  Filled 2022-01-23: qty 20, 12d supply, fill #0

## 2022-01-23 MED ORDER — PREDNISONE 50 MG PO TABS
60.0000 mg | ORAL_TABLET | Freq: Once | ORAL | Status: AC
  Administered 2022-01-23: 60 mg via ORAL
  Filled 2022-01-23: qty 1

## 2022-01-23 MED ORDER — KETOROLAC TROMETHAMINE 15 MG/ML IJ SOLN
15.0000 mg | Freq: Once | INTRAMUSCULAR | Status: AC
Start: 2022-01-23 — End: 2022-01-23
  Administered 2022-01-23: 15 mg via INTRAMUSCULAR
  Filled 2022-01-23: qty 1

## 2022-01-23 NOTE — ED Triage Notes (Signed)
Patient was here and wants a trigger point injection for his pinched nerve. Patient educated that the ED is usually not where they inject "cortisone shots"  ?Patient verbalized understanding and is looking for where he could possibly be seen for an injection.  ?

## 2022-01-23 NOTE — ED Provider Notes (Signed)
?[facility] EMERGENCY DEPARTMENT ?Provider Note ? ? ?CSN: [id number] ?Arrival date & time: 01/23/22  1234 ? ?  ? ?History ? ?Chief Complaint  ?Patient presents with  ? Neck Pain  ? ? ?Bradley Mcintosh is a 66 y.o. male. ? ?Patient presents to the emergency department today for evaluation of continued neck pain.  Patient has pain in the posterior neck which radiates into the right shoulder and arm.  Patient was seen in the emergency department for the symptoms on the morning of 01/21/2012.  He had a CT scan of the cervical spine performed at that time with findings as below.  Patient was discharged home on ibuprofen and methocarbamol.  He has been taking these.  He received a steroid injection and injection of Toradol while in the emergency department.  He states that he had improvement for about a day but symptoms have since returned.  No lower extremity symptoms or weakness.  No upper extremity weakness.  No vision changes or strokelike symptoms.  Pain is worse with movement or when he picks up his right arm.  He does not have a primary care doctor or a spine doctor. ? ?1. C5-6 3 mm grade 1 anterolisthesis, most likely due to ?degenerative disc disease, not suspected to be traumatic listhesis. ?2. C5-6 degenerative disc disease, spondylosis, and with right ?paracentral disc osteophyte complex compressing the right hemicord. ?No foraminal compromise. ?3. C3-4 small left paracentral disc protrusion slightly deforming ?the left ventral cord surface. ?4. Reversed cervical lordosis, with no evidence of fractures. ?5. Emphysema. ? ? ?  ? ?Home Medications ?Prior to Admission medications   ?Medication Sig Start Date End Date Taking? Authorizing Provider  ?ibuprofen (ADVIL) 800 MG tablet Take 1 tablet (800 mg total) by mouth every 6 (six) hours as needed for moderate pain. 01/20/22   Gilda Crease, MD  ?METHADONE HCL PO Take 40 mg by mouth 1 day or 1 dose.    [provider]  ?methocarbamol (ROBAXIN)  500 MG tablet Take 1 tablet (500 mg total) by mouth every 8 (eight) hours as needed for muscle spasms. 01/20/22   Gilda Crease, MD  ?   ? ?Allergies    ?Patient has no known allergies.   ? ?Review of Systems   ?Review of Systems ? ?Physical Exam ?Updated Vital Signs ?BP (!) 145/84 (BP Location: Right Arm)   Pulse 68   Temp 98.3 ?F (36.8 ?C) (Oral)   Resp 18   Ht 5\' 10"  (1.778 m)   Wt 95.3 kg   SpO2 96%   BMI 30.13 kg/m?  ?Physical Exam ?Vitals and nursing note reviewed.  ?Constitutional:   ?   Appearance: He is well-developed.  ?HENT:  ?   Head: Normocephalic and atraumatic.  ?Eyes:  ?   Conjunctiva/sclera: Conjunctivae normal.  ?Cardiovascular:  ?   Pulses:     ?     Radial pulses are 2+ on the right side.  ?Pulmonary:  ?   Effort: No respiratory distress.  ?Musculoskeletal:  ?   Right shoulder: Tenderness present. No bony tenderness.  ?   Left shoulder: No tenderness or bony tenderness.  ?   Cervical back: Neck supple. Tenderness (Right-sided paraspinous) present. Decreased range of motion.  ?   Thoracic back: No tenderness.  ?   Lumbar back: No tenderness.  ?Skin: ?   General: Skin is warm and dry.  ?Neurological:  ?   Mental Status: He is alert.  ? ? ?ED Results /  Procedures / Treatments   ?Labs ?(all labs ordered are listed, but only abnormal results are displayed) ?Labs Reviewed - No data to display ? ?EKG ?None ? ?Radiology ?No results found. ? ?Procedures ?Procedures  ? ? ?Medications Ordered in ED ?Medications  ?ketorolac (TORADOL) 15 MG/ML injection 15 mg (has no administration in time range)  ?predniSONE (DELTASONE) tablet 60 mg (has no administration in time range)  ? ? ?ED Course/ Medical Decision Making/ A&P ?  ? ?Patient seen and examined. History obtained directly from patient. Work-up including labs, imaging, EKG ordered in triage, if performed, were reviewed.   ? ?Labs/EKG: None ordered ? ?Imaging: None ordered ? ?Medications/Fluids: IM Toradol, p.o. prednisone ? ?Most recent vital  signs reviewed and are as follows: ?BP (!) 145/84 (BP Location: Right Arm)   Pulse 68   Temp 98.3 ?F (36.8 ?C) (Oral)   Resp 18   Ht 5\' 10"  (1.778 m)   Wt 95.3 kg   SpO2 96%   BMI 30.13 kg/m?  ? ?Initial impression: Neck pain, possible cervical radiculopathy, no lower extremity symptoms to suggest central cord syndrome. ? ?Home treatment plan: Continued rest, muscle relaxers as needed, will do taper course of prednisone and give neurosurgery referral. ? ?Return instructions discussed with patient: Uncontrolled pain, new or worsening weakness in the upper or lower extremities. ? ?Follow-up instructions discussed with patient: Follow-up with PCP and/or neurosurgery for further evaluation of symptoms. ? ?                        ?Medical Decision Making ?Risk ?Prescription drug management. ? ? ?Patient with ongoing right-sided neck pain.  CT the other night did suggest some compression at the right C5-6 level.  Symptoms related to cervical sprain or possibly cervical radiculopathy symptoms given extension of pain into the arm.  No weakness noted.  No lower extremity symptoms to suggest central cord syndrome.  Right upper extremity is well-perfused and I have low concern for DVT or arterial insufficiency.  We will give tapered course of prednisone to see if this helps over a longer course until patient can follow-up. ? ? ? ? ? ? ? ? ?Final Clinical Impression(s) / ED Diagnoses ?Final diagnoses:  ?Cervical radiculopathy  ? ? ?Rx / DC Orders ?ED Discharge Orders   ? ?      Ordered  ?  predniSONE (DELTASONE) 20 MG tablet       ? 01/23/22 1357  ? ?  ?  ? ?  ? ? ?  ?03/25/22, PA-C ?01/23/22 1401 ? ?  ?03/25/22, MD ?01/23/22 1416 ? ?

## 2022-01-23 NOTE — Discharge Instructions (Signed)
Please read and follow all provided instructions. ? ?Your diagnoses today include:  ?1. Cervical radiculopathy   ? ?Tests performed today include: ?Vital signs - see below for your results today ? ?Medications prescribed:  ?Prednisone - steroid medicine  ? ?It is best to take this medication in the morning to prevent sleeping problems. If you are diabetic, monitor your blood sugar closely and stop taking Prednisone if blood sugar is over 300. Take with food to prevent stomach upset.  ? ?Take any prescribed medications only as directed. ? ?Home care instructions:  ?Follow any educational materials contained in this packet ?Please rest, use ice or heat on your back for the next several days ?Do not lift, push, pull anything more than 10 pounds for the next week ?Please continue to use the ibuprofen and methocarbamol prescribed at the previous visit as desired. ? ?Follow-up instructions: ?Please follow-up with your primary care provider and/or neurosurgery referral in the next 1 week for further evaluation of your symptoms.  ? ?Return instructions:  ?SEEK IMMEDIATE MEDICAL ATTENTION IF YOU HAVE: ?New numbness, tingling, weakness, or problem with the use of your arms or legs ?Severe back pain not relieved with medications ?Loss control of your bowels or bladder ?Increasing pain in any areas of the body (such as chest or abdominal pain) ?Shortness of breath, dizziness, or fainting.  ?Worsening nausea (feeling sick to your stomach), vomiting, fever, or sweats ?Any other emergent concerns regarding your health  ? ?Additional Information: ? ?Your vital signs today were: ?BP (!) 145/84 (BP Location: Right Arm)   Pulse 68   Temp 98.3 ?F (36.8 ?C) (Oral)   Resp 18   Ht 5\' 10"  (1.778 m)   Wt 95.3 kg   SpO2 96%   BMI 30.13 kg/m?  ?If your blood pressure (BP) was elevated above 135/85 this visit, please have this repeated by your doctor within one month. ?-------------- ? ?

## 2022-02-09 ENCOUNTER — Emergency Department (HOSPITAL_BASED_OUTPATIENT_CLINIC_OR_DEPARTMENT_OTHER): Payer: BLUE CROSS/BLUE SHIELD

## 2022-02-09 ENCOUNTER — Emergency Department (HOSPITAL_BASED_OUTPATIENT_CLINIC_OR_DEPARTMENT_OTHER)
Admission: EM | Admit: 2022-02-09 | Discharge: 2022-02-09 | Disposition: A | Discharge status: Home / Self Care | Payer: BLUE CROSS/BLUE SHIELD | Attending: Emergency Medicine | Admitting: Emergency Medicine

## 2022-02-09 ENCOUNTER — Other Ambulatory Visit: Payer: Self-pay

## 2022-02-09 DIAGNOSIS — I4891 Unspecified atrial fibrillation: Secondary | ICD-10-CM | POA: Insufficient documentation

## 2022-02-09 DIAGNOSIS — R0602 Shortness of breath: Secondary | ICD-10-CM

## 2022-02-09 LAB — COMPREHENSIVE METABOLIC PANEL
ALT: 17 U/L (ref 0–44)
AST: 13 U/L — ABNORMAL LOW (ref 15–41)
Albumin: 3.6 g/dL (ref 3.5–5.0)
Alkaline Phosphatase: 89 U/L (ref 38–126)
Anion gap: 13 (ref 5–15)
BUN: 25 mg/dL — ABNORMAL HIGH (ref 8–23)
CO2: 20 mmol/L — ABNORMAL LOW (ref 22–32)
Calcium: 8.9 mg/dL (ref 8.9–10.3)
Chloride: 105 mmol/L (ref 98–111)
Creatinine, Ser: 0.94 mg/dL (ref 0.61–1.24)
GFR, Estimated: >60 mL/min (ref >60)
Glucose, Bld: 127 mg/dL — ABNORMAL HIGH (ref 70–99)
Potassium: 4.7 mmol/L (ref 3.5–5.1)
Sodium: 138 mmol/L (ref 135–145)
Total Bilirubin: 0.2 mg/dL — ABNORMAL LOW (ref 0.3–1.2)
Total Protein: 7 g/dL (ref 6.5–8.1)

## 2022-02-09 LAB — CBC
HCT: 38.5 % — ABNORMAL LOW (ref 39.0–52.0)
Hemoglobin: 12.2 g/dL — ABNORMAL LOW (ref 13.0–17.0)
MCH: 27.2 pg (ref 26.0–34.0)
MCHC: 31.7 g/dL (ref 30.0–36.0)
MCV: 85.9 fL (ref 80.0–100.0)
Platelets: 294 10*3/uL (ref 150–400)
RBC: 4.48 MIL/uL (ref 4.22–5.81)
RDW: 15.2 % (ref 11.5–15.5)
WBC: 17.7 10*3/uL — ABNORMAL HIGH (ref 4.0–10.5)
nRBC: 0 % (ref 0.0–0.2)

## 2022-02-09 LAB — MAGNESIUM: Magnesium: 2 mg/dL (ref 1.7–2.4)

## 2022-02-09 LAB — TROPONIN I (HIGH SENSITIVITY): Troponin I (High Sensitivity): 6 ng/L (ref <18)

## 2022-02-09 LAB — TSH: TSH: 1.359 u[IU]/mL (ref 0.350–4.500)

## 2022-02-09 LAB — BRAIN NATRIURETIC PEPTIDE: B Natriuretic Peptide: 361.1 pg/mL — ABNORMAL HIGH (ref 0.0–100.0)

## 2022-02-09 MED ORDER — METOPROLOL TARTRATE 5 MG/5ML IV SOLN
5.0000 mg | INTRAVENOUS | Status: DC | PRN
  Administered 2022-02-09 (×2): 5 mg via INTRAVENOUS
  Filled 2022-02-09 (×2): qty 5

## 2022-02-09 NOTE — ED Triage Notes (Signed)
Pt c/o shob and cough x 1 week, that worsened over the past 2 days. Breathing worsens with movement. Nothing makes breathing better. Denies cp, sore throat, fevers, hx of resp issues.

## 2022-02-09 NOTE — Discharge Instructions (Signed)
You were seen in the emergency department for 1 week of increased shortness of breath.  When you arrived here your heart rate was elevated and you were in atrial fibrillation.  You converted to normal sinus rhythm while you were here.  Some of your lab work was still pending at time of discharge.  We have placed a referral for you to follow-up with cardiology.  Return to the emergency department if any worsening or concerning symptoms.

## 2022-02-09 NOTE — ED Provider Notes (Signed)
[facility] Provider Note   CSN: [id number] Arrival date & time: 02/09/22  1607     History  Chief Complaint  Patient presents with   Shortness of Breath    [facility] is a 66 y.o. male.  Is a history of methadone use and was recently in the emergency department for right-sided radicular arm pain.  Currently on prednisone taper.  Complaining of 1 week of increased shortness of breath dyspnea on exertion.  Feeling generally fatigued.  No chest pain fever cough.  He said he is hearing some noisy breathing in his throat when he exhales.  He is a non-smoker and denies any drugs.  No leg swelling or pain.  The history is provided by the patient.  Shortness of Breath Severity:  Moderate Onset quality:  Gradual Duration:  1 week Timing:  Intermittent Progression:  Unchanged Chronicity:  New Context: activity   Relieved by:  Nothing Worsened by:  Activity Ineffective treatments:  Rest Associated symptoms: neck pain   Associated symptoms: no abdominal pain, no chest pain, no cough, no fever, no hemoptysis, no rash, no sore throat, no sputum production and no vomiting   Risk factors: no tobacco use       Home Medications Prior to Admission medications   Medication Sig Start Date End Date Taking? Authorizing Provider  ibuprofen (ADVIL) 800 MG tablet Take 1 tablet (800 mg total) by mouth every 6 (six) hours as needed for moderate pain. 01/20/22   Orpah Greek, MD  METHADONE HCL PO Take 40 mg by mouth 1 day or 1 dose.    [provider]  methocarbamol (ROBAXIN) 500 MG tablet Take 1 tablet (500 mg total) by mouth every 8 (eight) hours as needed for muscle spasms. 01/20/22   Pollina, Gwenyth Allegra, MD  predniSONE (DELTASONE) 20 MG tablet Take 3 tablets by mouth daily on days 1 - 3 , then 2 tablets on days 4 - 6, then 1 tablet on days 7 - 9, then 1/2 tablet on days 10 - 12. 01/23/22   Carlisle Cater, PA-C      Allergies    Patient has no  known allergies.    Review of Systems   Review of Systems  Constitutional:  Negative for fever.  HENT:  Negative for sore throat.   Eyes:  Negative for visual disturbance.  Respiratory:  Positive for shortness of breath. Negative for cough, hemoptysis and sputum production.   Cardiovascular:  Negative for chest pain.  Gastrointestinal:  Negative for abdominal pain and vomiting.  Genitourinary:  Negative for dysuria.  Musculoskeletal:  Positive for neck pain.  Skin:  Negative for rash.  Neurological:  Negative for speech difficulty.   Physical Exam Updated Vital Signs BP (!) 144/88 (BP Location: Right Arm)   Pulse 69   Temp 98 F (36.7 C) (Oral)   Resp (!) 24   SpO2 96%  Physical Exam Vitals and nursing note reviewed.  Constitutional:      General: He is not in acute distress.    Appearance: He is well-developed.  HENT:     Head: Normocephalic and atraumatic.  Eyes:     Conjunctiva/sclera: Conjunctivae normal.  Cardiovascular:     Rate and Rhythm: Tachycardia present. Rhythm irregular.     Heart sounds: No murmur heard. Pulmonary:     Effort: Pulmonary effort is normal. No respiratory distress.     Breath sounds: Normal breath sounds.  Abdominal:     Palpations: Abdomen is  soft.     Tenderness: There is no abdominal tenderness. There is no guarding or rebound.  Musculoskeletal:        General: No swelling. Normal range of motion.     Cervical back: Neck supple.     Right lower leg: No tenderness. No edema.     Left lower leg: No tenderness. No edema.  Skin:    General: Skin is warm and dry.     Capillary Refill: Capillary refill takes less than 2 seconds.  Neurological:     General: No focal deficit present.     Mental Status: He is alert.    ED Results / Procedures / Treatments   Labs (all labs ordered are listed, but only abnormal results are displayed) Labs Reviewed  CBC - Abnormal; Notable for the following components:      Result Value   WBC 17.7 (*)     Hemoglobin 12.2 (*)    HCT 38.5 (*)    All other components within normal limits  COMPREHENSIVE METABOLIC PANEL - Abnormal; Notable for the following components:   CO2 20 (*)    Glucose, Bld 127 (*)    BUN 25 (*)    AST 13 (*)    Total Bilirubin 0.2 (*)    All other components within normal limits  BRAIN NATRIURETIC PEPTIDE - Abnormal; Notable for the following components:   B Natriuretic Peptide 361.1 (*)    All other components within normal limits  TSH  MAGNESIUM  TROPONIN I (HIGH SENSITIVITY)    EKG EKG Interpretation  Date/Time:  Sunday Feb 09 2022 16:14:36 EDT Ventricular Rate:  157 PR Interval:    QRS Duration: 72 QT Interval:  292 QTC Calculation: 472 R Axis:   52 Text Interpretation: Atrial fibrillation with rapid ventricular response Abnormal ECG When compared with ECG of 19-Apr-2019 08:53, afib with RVR new Confirmed by Aletta Edouard [phone]) on 02/09/2022 4:27:17 PM  Radiology DG Chest Port 1 View  Result Date: 02/09/2022 CLINICAL DATA:  Shortness of breath and cough for 1 week. EXAM: PORTABLE CHEST 1 VIEW COMPARISON:  Chest radiograph dated April 19, 2019 FINDINGS: The heart is mildly enlarged. Lungs are clear without evidence of focal consolidation or pleural effusion. Mild thoracic spondylosis. No acute osseous abnormality. IMPRESSION: Mild cardiomegaly without evidence of focal consolidation or pleural effusion. Electronically Signed   By: Keane Police D.O.   On: 02/09/2022 16:54    Procedures .Critical Care Performed by: Hayden Rasmussen, MD Authorized by: Hayden Rasmussen, MD   Critical care provider statement:    Critical care time (minutes):  45   Critical care time was exclusive of:  Separately billable procedures and treating other patients   Critical care was necessary to treat or prevent imminent or life-threatening deterioration of the following conditions:  Cardiac failure   Critical care was time spent personally by me on the following  activities:  Development of treatment plan with patient or surrogate, discussions with consultants, evaluation of patient's response to treatment, examination of patient, obtaining history from patient or surrogate, ordering and performing treatments and interventions, ordering and review of laboratory studies, ordering and review of radiographic studies, pulse oximetry, re-evaluation of patient's condition and review of old charts   I assumed direction of critical care for this patient from another provider in my specialty: no      Medications Ordered in ED Medications - No data to display   ED Course/ Medical Decision Making/ A&P Clinical Course as  of 02/10/22 0959  Sun Feb 09, 2022  1637 Chest x-ray does not show any gross infiltrate or pneumothorax.  Awaiting radiology reading. [MB]  1854 After 2 doses of metoprolol 5 mg patient's heart rate abruptly slowed down to 98 and he is back in sinus.  Repeat EKG demonstrates same.  Awaiting for rest of labs and then will review with cardiology.  He is CHADVASC 1. [MB]  2001 CMP magnesium and TSH have not resulted.  Patient is unwilling to wait any longer.  Unfortunately our chemistry machine is down and samples of being sent to [facility].  He is unwilling to wait.  I will try to keep an eye out for these values call him if there is any significant findings.  I recommended that he follow-up with cardiology.  Due to his low CHA2DS2-VASc we will hold off on anticoagulation. [MB]    Clinical Course User Index [MB] Hayden Rasmussen, MD                           Medical Decision Making Amount and/or Complexity of Data Reviewed Labs: ordered. Radiology: ordered.  Risk Prescription drug management.  This patient complains of shortness of breath dyspnea on exertion; this involves an extensive number of treatment Options and is a complaint that carries with it a high risk of complications and morbidity. The differential includes pneumonia, PE,  pneumothorax, CHF, arrhythmia, ACS  I ordered, reviewed and interpreted labs, which included CBC with elevated white count, recently on steroids, hemoglobin low stable from priors, chemistries with low bicarb troponin flat, BNP elevated unclear baseline, TSH normal I ordered medication IV beta-blocker x2 with improvement in heart rate and reviewed PMP when indicated. I ordered imaging studies which included chest x-ray and I independently    visualized and interpreted imaging which showed cardiomegaly Additional history obtained from patient's companion Previous records obtained and reviewed in epic including recent ED visit Cardiac monitoring reviewed, atrial fibrillation with rapid ventricular response Social determinants considered, no significant barriers Critical Interventions: Initiation of IV beta-blockers for rapid A-fib  After the interventions stated above, I reevaluated the patient and found patient symptoms to be improved and converted to normal sinus rhythm Admission and further testing considered, patient does not wish admission to the hospital.  Have placed a referral for him to follow-up with cardiology.  We will hold off on anticoagulation as has low CHA2DS2-VASc score and patient not interested in anticoagulation.  Return instructions discussed  This patients CHA2DS2-VASc Score and unadjusted Ischemic Stroke Rate (% per year) is equal to 0.6 % stroke rate/year from a score of 1  Above score calculated as 1 [facility] each if present [CHF, HTN, DM, Vascular=MI/PAD/Aortic Plaque, Age if 65-74, or Male] Above score calculated as 2 points each if present [Age > 75, or Stroke/TIA/TE]             Final Clinical Impression(s) / ED Diagnoses Final diagnoses:  Atrial fibrillation with rapid ventricular response (HCC)  SOB (shortness of breath)  New onset atrial fibrillation [facility])    Rx / DC Orders ED Discharge Orders          Ordered    Ambulatory referral to Cardiology         02/09/22 2002              Hayden Rasmussen, MD 02/10/22 1003

## 2022-02-11 NOTE — Progress Notes (Unsigned)
    [facility] Fickle, MD Reason for referral-atrial fibrillation  HPI: 66 year old male for evaluation of atrial fibrillation at request of Meridee Score, MD.  Seen in the emergency room Feb 09, 2022 with complaints of dyspnea and general fatigue and noted to have atrial fibrillation with rapid ventricular response.  Chest x-ray without infiltrate, magnesium 2.0, white blood cell count 17.7, hemoglobin 12.2, potassium 4.7, creatinine 0.94, TSH 1.359 and BNP 361.  Patient converted to sinus rhythm in the emergency room.  Patient converted to sinus rhythm after 2 doses of metoprolol.  Cardiology now asked to evaluate.  Current Outpatient Medications  Medication Sig Dispense Refill   ibuprofen (ADVIL) 800 MG tablet Take 1 tablet (800 mg total) by mouth every 6 (six) hours as needed for moderate pain. 20 tablet 0   METHADONE HCL PO Take 40 mg by mouth 1 day or 1 dose.     methocarbamol (ROBAXIN) 500 MG tablet Take 1 tablet (500 mg total) by mouth every 8 (eight) hours as needed for muscle spasms. 20 tablet 0   predniSONE (DELTASONE) 20 MG tablet Take 3 tablets by mouth daily on days 1 - 3 , then 2 tablets on days 4 - 6, then 1 tablet on days 7 - 9, then 1/2 tablet on days 10 - 12. 20 tablet 0   No current facility-administered medications for this visit.    No Known Allergies   Past Medical History:  Diagnosis Date   Drug abuse (HCC)    Hypertension     No past surgical history on file.  Social History   Socioeconomic History   Marital status: Married    Spouse name: Not on file   Number of children: Not on file   Years of education: Not on file   Highest education level: Not on file  Occupational History   Not on file  Tobacco Use   Smoking status: Never   Smokeless tobacco: Never  Substance and Sexual Activity   Alcohol use: No   Drug use: Not Currently    Comment: Opioids    Sexual activity: Never  Other Topics Concern   Not on file  Social History Narrative    Not on file   Social Determinants of Health   Financial Resource Strain: Not on file  Food Insecurity: Not on file  Transportation Needs: Not on file  Physical Activity: Not on file  Stress: Not on file  Social Connections: Not on file  Intimate Partner Violence: Not on file    No family history on file.  ROS: no fevers or chills, productive cough, hemoptysis, dysphasia, odynophagia, melena, hematochezia, dysuria, hematuria, rash, seizure activity, orthopnea, PND, pedal edema, claudication. Remaining systems are negative.  Physical Exam:   There were no vitals taken for this visit.  General:  Well developed/well nourished in NAD Skin warm/dry Patient not depressed No peripheral clubbing Back-normal HEENT-normal/normal eyelids Neck supple/normal carotid upstroke bilaterally; no bruits; no JVD; no thyromegaly chest - CTA/ normal expansion CV - RRR/normal S1 and S2; no murmurs, rubs or gallops;  PMI nondisplaced Abdomen -NT/ND, no HSM, no mass, + bowel sounds, no bruit 2+ femoral pulses, no bruits Ext-no edema, chords, 2+ DP Neuro-grossly nonfocal  ECG -Feb 09, 2022-atrial fibrillation with rapid ventricular response.  Follow-up ECG showed sinus rhythm with probable early repolarization abnormality.  Personally reviewed  A/P  1 paroxysmal atrial fibrillation-  2 hypertension-  3 history of substance abuse-  Olga Millers, MD

## 2022-02-12 ENCOUNTER — Encounter: Payer: Self-pay | Admitting: Cardiology

## 2022-02-12 ENCOUNTER — Ambulatory Visit: Payer: BLUE CROSS/BLUE SHIELD | Admitting: Cardiology

## 2022-02-12 VITALS — BP 138/78 | HR 93 | Ht 70.05 in | Wt 234.0 lb

## 2022-02-12 DIAGNOSIS — I10 Essential (primary) hypertension: Secondary | ICD-10-CM | POA: Diagnosis not present

## 2022-02-12 DIAGNOSIS — I480 Paroxysmal atrial fibrillation: Secondary | ICD-10-CM

## 2022-02-12 MED ORDER — FUROSEMIDE 20 MG PO TABS: ORAL_TABLET | ORAL | 3 refills | Status: DC

## 2022-02-12 MED ORDER — METOPROLOL SUCCINATE ER 50 MG PO TB24: 50.0000 mg | ORAL_TABLET | Freq: Every day | ORAL | 3 refills | Status: DC

## 2022-02-12 MED ORDER — FUROSEMIDE 20 MG PO TABS: ORAL_TABLET | ORAL | 3 refills | Status: AC

## 2022-02-12 MED ORDER — METOPROLOL SUCCINATE ER 50 MG PO TB24: 50.0000 mg | ORAL_TABLET | Freq: Every day | ORAL | 3 refills | Status: AC

## 2022-02-12 MED ORDER — ASPIRIN 81 MG PO TBEC: 81.0000 mg | DELAYED_RELEASE_TABLET | Freq: Every day | ORAL | 3 refills | Status: AC

## 2022-02-12 NOTE — Patient Instructions (Signed)
Medication Instructions:   START ASPIRIN 81 MG ONCE DAILY  START METOPROLOL SUCC ER 50 MG ONCE DAILY AT BEDTIME  TAKE FUROSEMIDE 20 MG ONCE DAILY X 2 DAYS AND THEN AS NEEDED FOR SWELLING  *If you need a refill on your cardiac medications before your next appointment, please call your pharmacy*   Lab Work:  Your physician recommends that you return for lab work in: ONE WEEK-DO NOT NEED TO FAST  If you have labs (blood work) drawn today and your tests are completely normal, you will receive your results only by: [place] (if you have MyChart) OR A paper copy in the mail If you have any lab test that is abnormal or we need to change your treatment, we will call you to review the results.   Testing/Procedures:  Your physician has requested that you have an echocardiogram. Echocardiography is a painless test that uses sound waves to create images of your heart. It provides your doctor with information about the size and shape of your heart and how well your heart's chambers and valves are working. This procedure takes approximately one hour. There are no restrictions for this procedure. [place] [facility]-1ST FLOOR IMAGING    Follow-Up: At [facility], you and your health needs are our priority.  As part of our continuing mission to provide you with exceptional heart care, we have created designated Provider Care Teams.  These Care Teams include your primary Cardiologist (physician) and Advanced Practice Providers (APPs -  Physician Assistants and Nurse Practitioners) who all work together to provide you with the care you need, when you need it.  We recommend signing up for the patient portal called "MyChart".  Sign up information is provided on this After Visit Summary.  MyChart is used to connect with patients for Virtual Visits (Telemedicine).  Patients are able to view lab/test results, encounter notes, upcoming appointments, etc.  Non-urgent messages can be sent to your  provider as well.   To learn more about what you can do with MyChart, go to [email or web address].    Your next appointment:   3-4 month(s)  The format for your next appointment:   In Person  Provider:   Kirk Ruths, MD      Important Information About Sugar

## 2022-03-04 ENCOUNTER — Ambulatory Visit (HOSPITAL_BASED_OUTPATIENT_CLINIC_OR_DEPARTMENT_OTHER): Payer: BLUE CROSS/BLUE SHIELD

## 2022-03-05 ENCOUNTER — Other Ambulatory Visit (HOSPITAL_BASED_OUTPATIENT_CLINIC_OR_DEPARTMENT_OTHER): Payer: BLUE CROSS/BLUE SHIELD

## 2022-04-30 NOTE — Progress Notes (Deleted)
HPI: FU atrial fibrillation.  Seen in the emergency room 5/23 with atrial fibrillation with rapid ventricular response. Patient converted to sinus rhythm in the emergency room. Patient converted to sinus rhythm after 2 doses of metoprolol.  Echocardiogram ordered at last office visit but not performed.  Since last seen  Current Outpatient Medications  Medication Sig Dispense Refill   aspirin EC 81 MG tablet Take 1 tablet (81 mg total) by mouth daily. Swallow whole. 90 tablet 3   cyclobenzaprine (FLEXERIL) 10 MG tablet Take 10 mg by mouth 2 (two) times daily as needed.     ferrous sulfate 325 (65 FE) MG tablet Take by mouth.     furosemide (LASIX) 20 MG tablet Take 1 tablet daily x 2 days and then as needed for swelling 90 tablet 3   gabapentin (NEURONTIN) 300 MG capsule Take 300 mg by mouth 3 (three) times daily.     ibuprofen (ADVIL) 800 MG tablet Take 1 tablet (800 mg total) by mouth every 6 (six) hours as needed for moderate pain. 20 tablet 0   METHADONE HCL PO Take 40 mg by mouth 1 day or 1 dose.     methocarbamol (ROBAXIN) 500 MG tablet Take 1 tablet (500 mg total) by mouth every 8 (eight) hours as needed for muscle spasms. 20 tablet 0   metoprolol succinate (TOPROL XL) 50 MG 24 hr tablet Take 1 tablet (50 mg total) by mouth daily. 90 tablet 3   omeprazole (PRILOSEC) 40 MG capsule Take 1 capsule by mouth daily.     predniSONE (DELTASONE) 20 MG tablet Take 3 tablets by mouth daily on days 1 - 3 , then 2 tablets on days 4 - 6, then 1 tablet on days 7 - 9, then 1/2 tablet on days 10 - 12. 20 tablet 0   tadalafil (CIALIS) 10 MG tablet Take by mouth as needed.     No current facility-administered medications for this visit.     Past Medical History:  Diagnosis Date   Drug abuse (HCC)    Hepatitis C    Hypertension     Past Surgical History:  Procedure Laterality Date   No prior surgery      Social History   Socioeconomic History   Marital status: Divorced    Spouse  name: Not on file   Number of children: Not on file   Years of education: Not on file   Highest education level: Not on file  Occupational History   Not on file  Tobacco Use   Smoking status: Former    Types: Cigarettes   Smokeless tobacco: Never  Substance and Sexual Activity   Alcohol use: No   Drug use: Not Currently    Comment: Opioids    Sexual activity: Never  Other Topics Concern   Not on file  Social History Narrative   Not on file   Social Determinants of Health   Financial Resource Strain: Not on file  Food Insecurity: Not on file  Transportation Needs: Not on file  Physical Activity: Not on file  Stress: Not on file  Social Connections: Not on file  Intimate Partner Violence: Not on file    Family History  Adopted: Yes    ROS: no fevers or chills, productive cough, hemoptysis, dysphasia, odynophagia, melena, hematochezia, dysuria, hematuria, rash, seizure activity, orthopnea, PND, pedal edema, claudication. Remaining systems are negative.  Physical Exam: Well-developed well-nourished in no acute distress.  Skin is warm and dry.  HEENT is normal.  Neck is supple.  Chest is clear to auscultation with normal expansion.  Cardiovascular exam is regular rate and rhythm.  Abdominal exam nontender or distended. No masses palpated. Extremities show no edema. neuro grossly intact  ECG- personally reviewed  A/P  1 paroxysmal atrial fibrillation-CHA2DS2-VASc is 1 for hypertension.  Given history of substance abuse we elected not to anticoagulate and instead treated with aspirin 81 mg daily.  Continue Toprol.  Reschedule echocardiogram to assess LV function.  2 hypertension-  3 history of substance abuse-continue methadone.  Olga Millers, MD

## 2022-05-14 ENCOUNTER — Encounter: Payer: Self-pay | Admitting: Cardiology

## 2022-05-14 ENCOUNTER — Ambulatory Visit: Payer: BLUE CROSS/BLUE SHIELD | Attending: Cardiology | Admitting: Cardiology

## 2022-07-28 ENCOUNTER — Other Ambulatory Visit: Payer: Self-pay

## 2023-04-09 ENCOUNTER — Other Ambulatory Visit: Payer: Self-pay

## 2023-04-09 ENCOUNTER — Emergency Department (HOSPITAL_BASED_OUTPATIENT_CLINIC_OR_DEPARTMENT_OTHER): Payer: BLUE CROSS/BLUE SHIELD

## 2023-04-09 ENCOUNTER — Emergency Department (HOSPITAL_BASED_OUTPATIENT_CLINIC_OR_DEPARTMENT_OTHER)
Admission: EM | Admit: 2023-04-09 | Discharge: 2023-04-09 | Disposition: A | Discharge status: Home / Self Care | Payer: BLUE CROSS/BLUE SHIELD | Attending: Emergency Medicine | Admitting: Emergency Medicine

## 2023-04-09 ENCOUNTER — Encounter (HOSPITAL_BASED_OUTPATIENT_CLINIC_OR_DEPARTMENT_OTHER): Payer: Self-pay

## 2023-04-09 DIAGNOSIS — S299XXA Unspecified injury of thorax, initial encounter: Secondary | ICD-10-CM | POA: Diagnosis present

## 2023-04-09 DIAGNOSIS — Z7982 Long term (current) use of aspirin: Secondary | ICD-10-CM | POA: Diagnosis not present

## 2023-04-09 DIAGNOSIS — Z79899 Other long term (current) drug therapy: Secondary | ICD-10-CM | POA: Insufficient documentation

## 2023-04-09 DIAGNOSIS — W010XXA Fall on same level from slipping, tripping and stumbling without subsequent striking against object, initial encounter: Secondary | ICD-10-CM | POA: Diagnosis not present

## 2023-04-09 DIAGNOSIS — I10 Essential (primary) hypertension: Secondary | ICD-10-CM | POA: Insufficient documentation

## 2023-04-09 DIAGNOSIS — S20212A Contusion of left front wall of thorax, initial encounter: Secondary | ICD-10-CM | POA: Diagnosis not present

## 2023-04-09 MED ORDER — IBUPROFEN 800 MG PO TABS
800.0000 mg | ORAL_TABLET | Freq: Once | ORAL | Status: AC
  Administered 2023-04-09: 800 mg via ORAL
  Filled 2023-04-09: qty 1

## 2023-04-09 MED ORDER — LIDOCAINE 5 % EX PTCH: 1.0000 | MEDICATED_PATCH | CUTANEOUS | 0 refills | Status: AC

## 2023-04-09 MED ORDER — LIDOCAINE 5 % EX PTCH
1.0000 | MEDICATED_PATCH | CUTANEOUS | Status: DC
  Administered 2023-04-09: 1 via TRANSDERMAL
  Filled 2023-04-09: qty 1

## 2023-04-09 NOTE — Discharge Instructions (Signed)
As we discussed, I would like for you to take 600 mg of ibuprofen every 6 hours.  You can Stach Tylenol at the 3 to 4-hour mark if you are still having pain.  It is important that you take normal breaths and occasional deep breaths to prevent the risk of developing pneumonia.  I also given you lidocaine patches which you can place over the area which will provide temporary pain relief.  May return to the emergency ferment for any worsening symptoms.  Otherwise, follow-up with your primary care doctor if you have one.

## 2023-04-09 NOTE — ED Triage Notes (Signed)
Pt states he fell a few days ago landing on his left side. Pt c/o left rib pain and sob due to taking a deep breath. Pt wanting a xray of his ribs.

## 2023-04-09 NOTE — ED Provider Notes (Signed)
[place] [facility] Provider Note   CSN: [id number] Arrival date & time: 04/09/23  2952     History Chief Complaint  Patient presents with   Bradley Mcintosh    Bradley Mcintosh is a 67 y.o. male patient with history of hepatitis C and hypertension who presents to the emergency department today with left lateral chest wall pain that been persistent really over the last week.  He states that last Friday he was walking when he tripped and fell forward and try to catch himself and hit his left side of the chest wall against the sidewalk.  Since then has been sore and painful.  It is worse with respirations primarily with deep respirations.  He denies any hemoptysis, ecchymosis of the area, general weakness, syncope.  He denies any other injury.   Fall       Home Medications Prior to Admission medications   Medication Sig Start Date End Date Taking? Authorizing Provider  lidocaine (LIDODERM) 5 % Place 1 patch onto the skin daily. Remove & Discard patch within 12 hours or as directed by MD 04/09/23  Yes Teressa Lower, PA-C  aspirin EC 81 MG tablet Take 1 tablet (81 mg total) by mouth daily. Swallow whole. 02/12/22   Lewayne Bunting, MD  cyclobenzaprine (FLEXERIL) 10 MG tablet Take 10 mg by mouth 2 (two) times daily as needed. 01/29/22   [provider]  ferrous sulfate 325 (65 FE) MG tablet Take by mouth. 06/26/21   [provider]  furosemide (LASIX) 20 MG tablet Take 1 tablet daily x 2 days and then as needed for swelling 02/12/22   Lewayne Bunting, MD  gabapentin (NEURONTIN) 300 MG capsule Take 300 mg by mouth 3 (three) times daily. 01/29/22   [provider]  ibuprofen (ADVIL) 800 MG tablet Take 1 tablet (800 mg total) by mouth every 6 (six) hours as needed for moderate pain. 01/20/22   Gilda Crease, MD  METHADONE HCL PO Take 40 mg by mouth 1 day or 1 dose.    [provider]  methocarbamol (ROBAXIN) 500 MG  tablet Take 1 tablet (500 mg total) by mouth every 8 (eight) hours as needed for muscle spasms. 01/20/22   Gilda Crease, MD  metoprolol succinate (TOPROL XL) 50 MG 24 hr tablet Take 1 tablet (50 mg total) by mouth daily. 02/12/22   Lewayne Bunting, MD  omeprazole (PRILOSEC) 40 MG capsule Take 1 capsule by mouth daily. 01/05/20   [provider]  predniSONE (DELTASONE) 20 MG tablet Take 3 tablets by mouth daily on days 1 - 3 , then 2 tablets on days 4 - 6, then 1 tablet on days 7 - 9, then 1/2 tablet on days 10 - 12. 01/23/22   Renne Crigler, PA-C  tadalafil (CIALIS) 10 MG tablet Take by mouth as needed. 01/05/20   [provider]      Allergies    Patient has no known allergies.    Review of Systems   Review of Systems  All other systems reviewed and are negative.   Physical Exam Updated Vital Signs BP (!) 140/83   Pulse 80   Temp 98.7 F (37.1 C) (Oral)   Resp 18   SpO2 96%  Physical Exam Vitals and nursing note reviewed.  Constitutional:      Appearance: Normal appearance.  HENT:     Head: Normocephalic and atraumatic.  Eyes:     General:  Right eye: No discharge.        Left eye: No discharge.     Conjunctiva/sclera: Conjunctivae normal.  Pulmonary:     Effort: Pulmonary effort is normal.     Breath sounds: Normal breath sounds. No decreased air movement.  Chest:     Comments: Left lateral chest wall at the nipple line is tender to palpation.  There is no overlying rash or ecchymosis.  There is equal chest rise. Skin:    General: Skin is warm and dry.     Findings: No rash.  Neurological:     General: No focal deficit present.     Mental Status: He is alert.  Psychiatric:        Mood and Affect: Mood normal.        Behavior: Behavior normal.     ED Results / Procedures / Treatments   Labs (all labs ordered are listed, but only abnormal results are displayed) Labs Reviewed - No data to display  EKG None  Radiology DG Chest 2  View  Result Date: 04/09/2023 CLINICAL DATA:  Left rib pain. EXAM: CHEST - 2 VIEW COMPARISON:  02/09/2022. FINDINGS: Clear lungs. Normal heart size and mediastinal contours. No pleural effusion or pneumothorax. Visualized bones and upper abdomen are unremarkable. IMPRESSION: No evidence of acute cardiopulmonary disease. Electronically Signed   By: Orvan Falconer M.D.   On: 04/09/2023 08:55    Procedures Procedures    Medications Ordered in ED Medications  lidocaine (LIDODERM) 5 % 1 patch (has no administration in time range)  ibuprofen (ADVIL) tablet 800 mg (800 mg Oral Given 04/09/23 8657)    ED Course/ Medical Decision Making/ A&P   {   Click here for ABCD2, HEART and other calculators  Medical Decision Making Bradley Mcintosh is a 67 y.o. male patient who presents to the emergency department today for further evaluation of left-sided rib pain.  Imaging was ordered in triage interpreted by myself.  Denies any evidence of rib fracture.  I do agree with radiologist interpretation.  Patient has not been taking any analgesics at home.  I will start conservatively with ibuprofen and Tylenol.  I instructed the patient to take 600 mg of ibuprofen every 6 hours as needed for pain and to Tylenol if this is a new patient.  I will also give him a lidocaine patch here to see if this improves his pain.  I will also give him a prescription for lidocaine patch.  He was instructed to take deep breaths I will treat this as rib contusion.  Strict turn precaution were discussed.  He will follow-up with your primary care doctor. He is safe for discharge at this time.   Amount and/or Complexity of Data Reviewed Radiology: ordered.  Risk Prescription drug management.    Final Clinical Impression(s) / ED Diagnoses Final diagnoses:  Contusion of rib on left side, initial encounter    Rx / DC Orders ED Discharge Orders          Ordered    lidocaine (LIDODERM) 5 %  Every 24 hours        04/09/23 0922               Teressa Lower, PA-C 04/09/23 8469    Alvira Monday, MD 04/09/23 2222

## 2023-07-19 IMAGING — CT CT CERVICAL SPINE W/O CM
3 of 4 series · 12 of 33 positions shown, 14 images · non-contrast
Comparison: None Available.

CLINICAL DATA: Neck trauma with neuro deficit and paresthesias.



[Series 3: c spine soft · axial · 0.35mm/px · z∈[-151,-75]mm · 3 of 96 slices shown]
[im 20/96  soft-tissue]
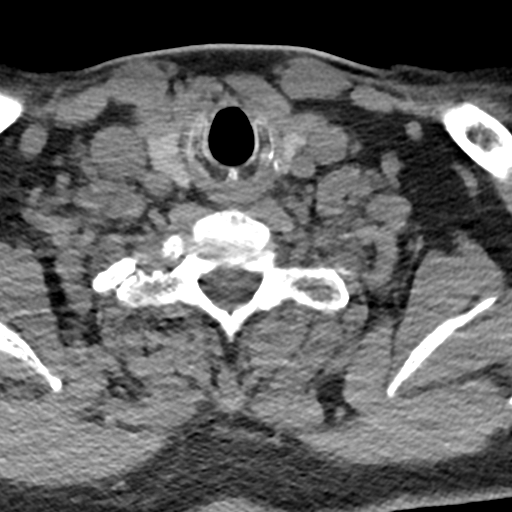
[im 39/96  soft-tissue]
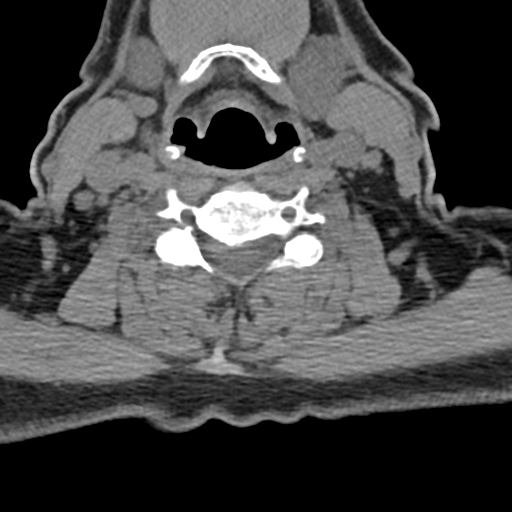
[im 58/96  soft-tissue]
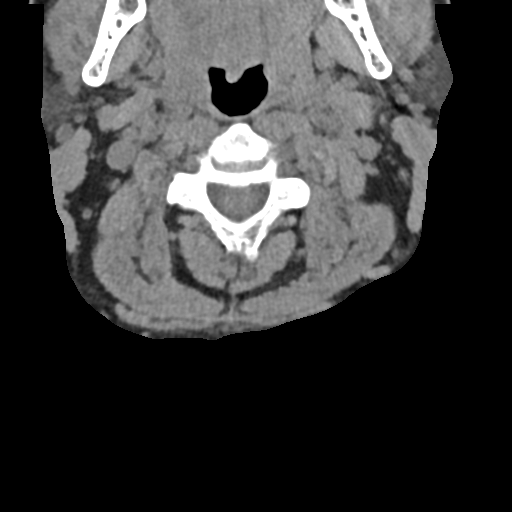

[Series 6: coronal bone · coronal · 0.21mm/px · 3 of 62 slices shown]
[im 13/62  bone]
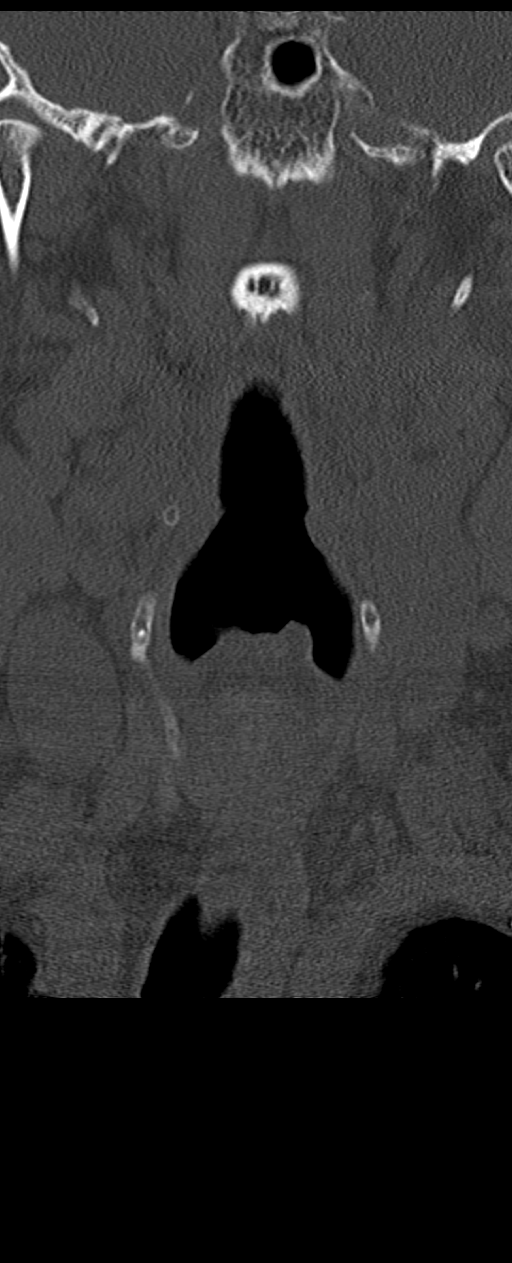
[im 25/62  bone]
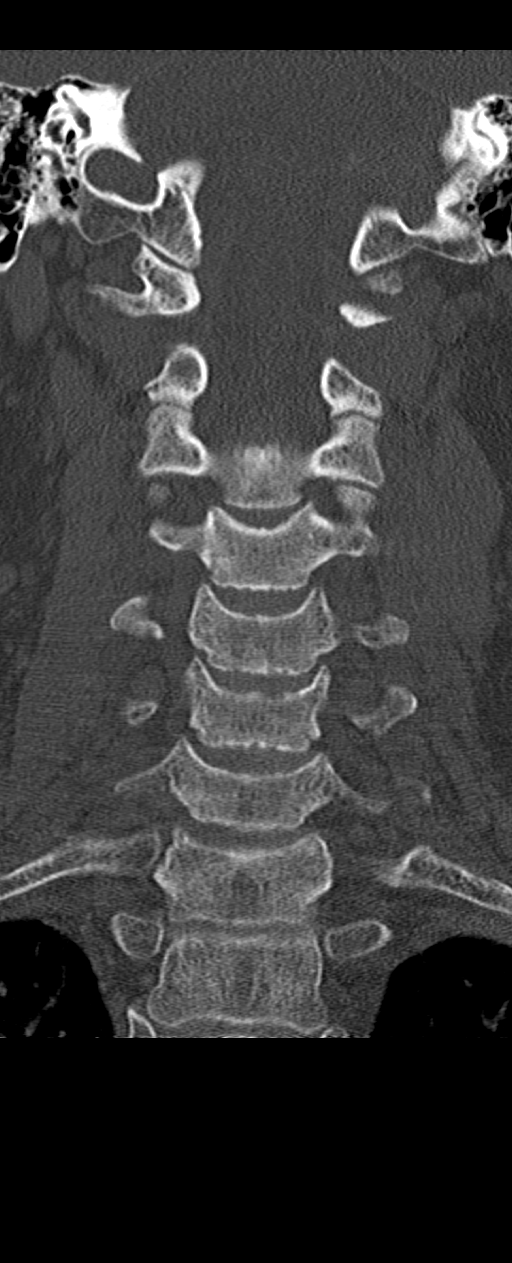
[im 37/62  bone]
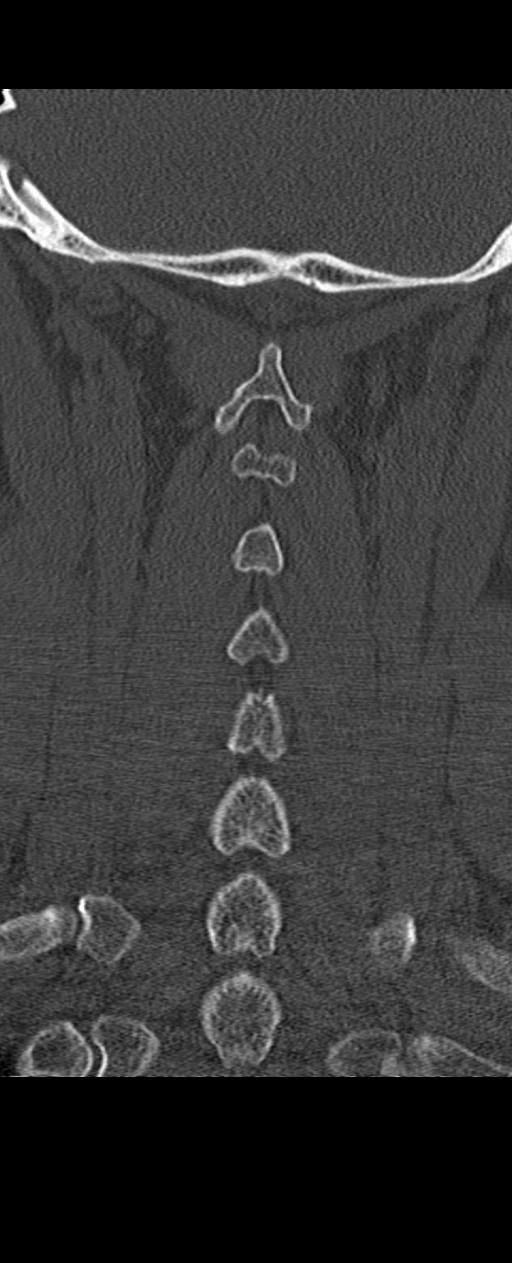

[Series 7: orthogonal bone · axial · 0.22mm/px · z∈[-214,-52]mm · 6 of 129 slices shown, 8 images]
[im 19/129  soft-tissue]
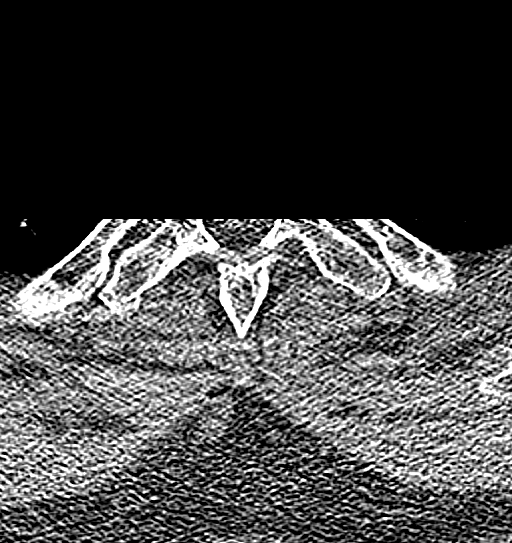
[im 19/129  bone]
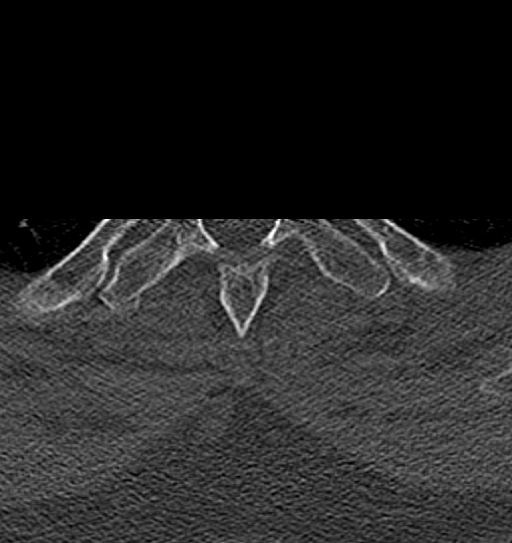
[im 37/129  bone]
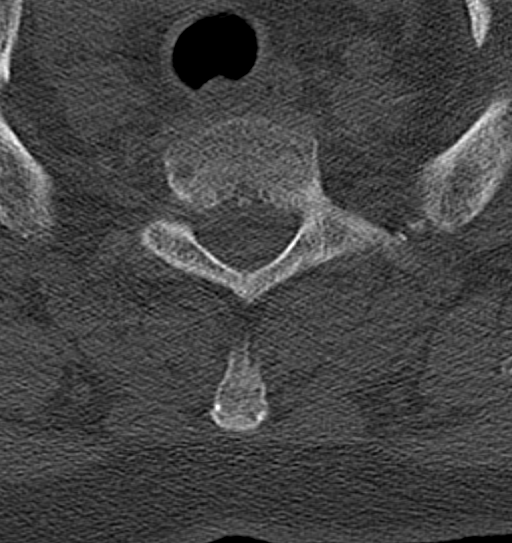
[im 55/129  bone]
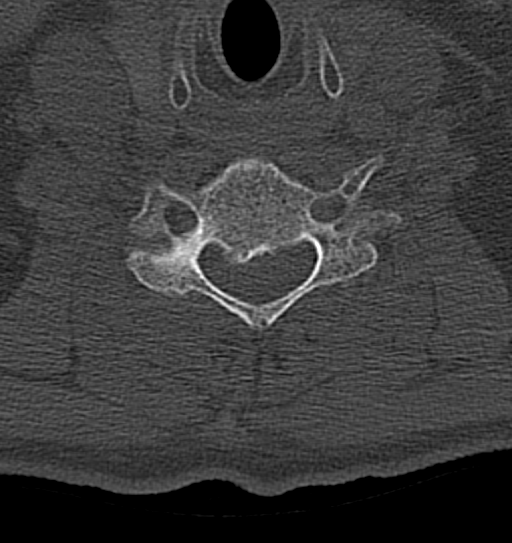
[im 74/129  bone]
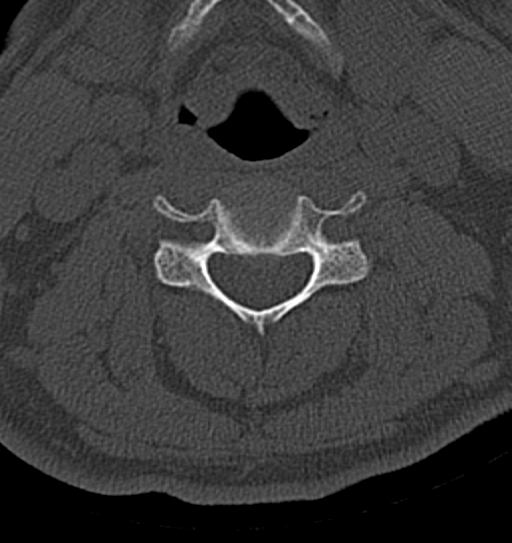
[im 92/129  soft-tissue]
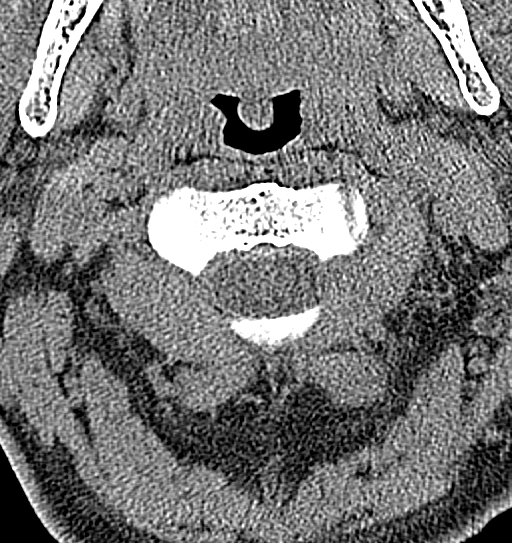
[im 92/129  bone]
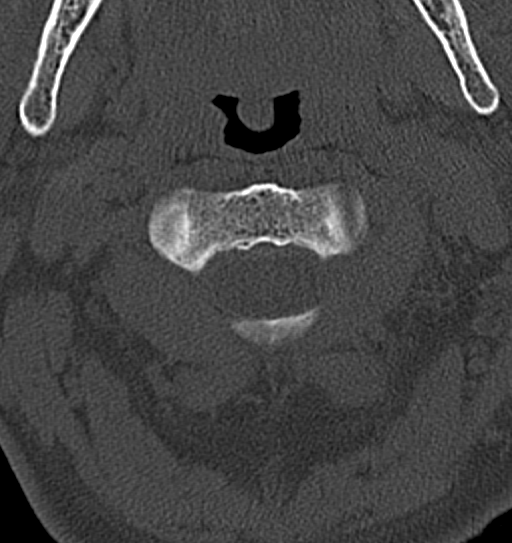
[im 110/129  bone]
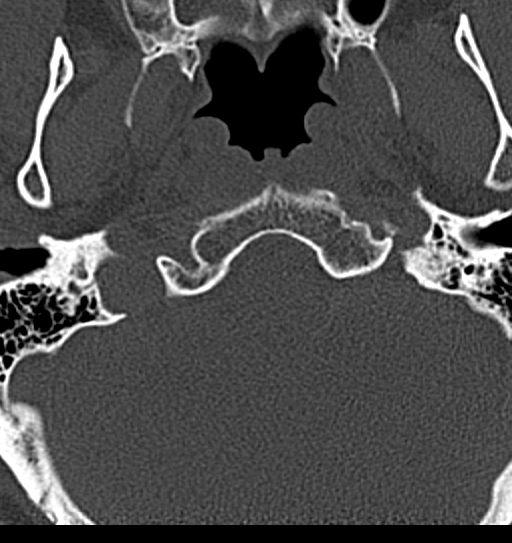

[12 of 33 positions shown; findings below may reference images not displayed]

FINDINGS: Alignment: There is a reversed cervical lordosis and a 3 mm C5-6
anterolisthesis, the latter is most likely due to discogenic
degenerative arthrosis.

No further listhesis is seen. There is narrowing and spurring of the
anterior atlantodental joint

Skull base and vertebrae: The bone mineralization is normal. No
fracture or primary bone lesion is seen.

Soft tissues and spinal canal: No prevertebral fluid or swelling. No
visible canal hematoma. No thyroid mass is seen. There is minimal
calcification in both proximal cervical ICAs.

Disc levels: At C5-6 there is mild-to-moderate disc space loss and
there are bidirectional osteophytes. Posteriorly, a right
paracentral disc osteophyte complex impinges the right hemicord
causing about 5 mm of thecal sac AP stenosis in the midline to right
of midline.

Other discs are normal in heights. At C3-4 there is small left
paracentral disc protrusion slightly deforming the left ventral cord
surface. At C6-7 there is a mild nonstenosing right posterior disc
osteophyte complex.

Other levels do not show significant soft tissue or bony
encroachment on the thecal sac.

There is slight facet joint and trace uncinate spurring at most
levels but no levels show foraminal compromise.

Upper chest: Mild paraseptal emphysema both lung apices.

Other: None.
IMPRESSION: 1. C5-6 3 mm grade 1 anterolisthesis, most likely due to
degenerative disc disease, not suspected to be traumatic listhesis.
2. C5-6 degenerative disc disease, spondylosis, and with right
paracentral disc osteophyte complex compressing the right hemicord.
No foraminal compromise.
3. C3-4 small left paracentral disc protrusion slightly deforming
the left ventral cord surface.
4. Reversed cervical lordosis, with no evidence of fractures.
5. Emphysema.

## 2023-09-08 ENCOUNTER — Other Ambulatory Visit: Payer: Self-pay

## 2023-09-08 ENCOUNTER — Encounter (HOSPITAL_BASED_OUTPATIENT_CLINIC_OR_DEPARTMENT_OTHER): Payer: Self-pay | Admitting: Emergency Medicine

## 2023-09-08 ENCOUNTER — Emergency Department (HOSPITAL_BASED_OUTPATIENT_CLINIC_OR_DEPARTMENT_OTHER)
Admission: EM | Admit: 2023-09-08 | Discharge: 2023-09-08 | Disposition: A | Discharge status: Home / Self Care | Payer: BLUE CROSS/BLUE SHIELD | Attending: Emergency Medicine | Admitting: Emergency Medicine

## 2023-09-08 ENCOUNTER — Emergency Department (HOSPITAL_BASED_OUTPATIENT_CLINIC_OR_DEPARTMENT_OTHER): Payer: BLUE CROSS/BLUE SHIELD

## 2023-09-08 DIAGNOSIS — Z79899 Other long term (current) drug therapy: Secondary | ICD-10-CM | POA: Diagnosis not present

## 2023-09-08 DIAGNOSIS — D72829 Elevated white blood cell count, unspecified: Secondary | ICD-10-CM | POA: Insufficient documentation

## 2023-09-08 DIAGNOSIS — I10 Essential (primary) hypertension: Secondary | ICD-10-CM | POA: Insufficient documentation

## 2023-09-08 DIAGNOSIS — K8020 Calculus of gallbladder without cholecystitis without obstruction: Secondary | ICD-10-CM | POA: Diagnosis not present

## 2023-09-08 DIAGNOSIS — R1011 Right upper quadrant pain: Secondary | ICD-10-CM

## 2023-09-08 DIAGNOSIS — Z7982 Long term (current) use of aspirin: Secondary | ICD-10-CM | POA: Insufficient documentation

## 2023-09-08 DIAGNOSIS — R109 Unspecified abdominal pain: Secondary | ICD-10-CM | POA: Diagnosis present

## 2023-09-08 LAB — COMPREHENSIVE METABOLIC PANEL
ALT: 22 U/L (ref 0–44)
AST: 20 U/L (ref 15–41)
Albumin: 3.9 g/dL (ref 3.5–5.0)
Alkaline Phosphatase: 95 U/L (ref 38–126)
Anion gap: 6 (ref 5–15)
BUN: 19 mg/dL (ref 8–23)
CO2: 24 mmol/L (ref 22–32)
Calcium: 8.6 mg/dL — ABNORMAL LOW (ref 8.9–10.3)
Chloride: 107 mmol/L (ref 98–111)
Creatinine, Ser: 0.92 mg/dL (ref 0.61–1.24)
GFR, Estimated: >60 mL/min (ref >60)
Glucose, Bld: 131 mg/dL — ABNORMAL HIGH (ref 70–99)
Potassium: 3.7 mmol/L (ref 3.5–5.1)
Sodium: 137 mmol/L (ref 135–145)
Total Bilirubin: 0.4 mg/dL (ref <1.2)
Total Protein: 7.4 g/dL (ref 6.5–8.1)

## 2023-09-08 LAB — CBC WITH DIFFERENTIAL/PLATELET
Abs Immature Granulocytes: 0.04 10*3/uL (ref 0.00–0.07)
Basophils Absolute: 0 10*3/uL (ref 0.0–0.1)
Basophils Relative: 0 %
Eosinophils Absolute: 0.3 10*3/uL (ref 0.0–0.5)
Eosinophils Relative: 2 %
HCT: 39.4 % (ref 39.0–52.0)
Hemoglobin: 13.2 g/dL (ref 13.0–17.0)
Immature Granulocytes: 0 %
Lymphocytes Relative: 26 %
Lymphs Abs: 2.8 10*3/uL (ref 0.7–4.0)
MCH: 28 pg (ref 26.0–34.0)
MCHC: 33.5 g/dL (ref 30.0–36.0)
MCV: 83.5 fL (ref 80.0–100.0)
Monocytes Absolute: 0.9 10*3/uL (ref 0.1–1.0)
Monocytes Relative: 8 %
Neutro Abs: 6.8 10*3/uL (ref 1.7–7.7)
Neutrophils Relative %: 64 %
Platelets: 277 10*3/uL (ref 150–400)
RBC: 4.72 MIL/uL (ref 4.22–5.81)
RDW: 13.2 % (ref 11.5–15.5)
WBC: 10.8 10*3/uL — ABNORMAL HIGH (ref 4.0–10.5)
nRBC: 0 % (ref 0.0–0.2)

## 2023-09-08 LAB — URINALYSIS, ROUTINE W REFLEX MICROSCOPIC
Bilirubin Urine: NEGATIVE
Glucose, UA: NEGATIVE mg/dL
Ketones, ur: NEGATIVE mg/dL
Leukocytes,Ua: NEGATIVE
Nitrite: NEGATIVE
Protein, ur: NEGATIVE mg/dL
Specific Gravity, Urine: >=1.03 (ref 1.005–1.030)
pH: 5.5 (ref 5.0–8.0)

## 2023-09-08 LAB — URINALYSIS, MICROSCOPIC (REFLEX)

## 2023-09-08 LAB — LIPASE, BLOOD: Lipase: 49 U/L (ref 11–51)

## 2023-09-08 MED ORDER — IOHEXOL 300 MG/ML  SOLN
100.0000 mL | Freq: Once | INTRAMUSCULAR | Status: AC | PRN
  Administered 2023-09-08: 100 mL via INTRAVENOUS

## 2023-09-08 NOTE — ED Triage Notes (Signed)
Right side abdominal/flank pain X 1 week worse tonight.

## 2023-09-08 NOTE — ED Provider Notes (Signed)
[place] [facility] Provider Note  CSN: [id number] Arrival date & time: 09/08/23 8657  Chief Complaint(s) Flank Pain  HPI Bradley Mcintosh is a 67 y.o. male     Flank Pain This is a new problem. The current episode started more than 2 days ago. The problem occurs constantly. The problem has been gradually worsening. Pertinent negatives include no chest pain, no abdominal pain, no headaches and no shortness of breath. Nothing aggravates the symptoms. Nothing relieves the symptoms. He has tried nothing for the symptoms.    Past Medical History Past Medical History:  Diagnosis Date   Drug abuse (HCC)    Hepatitis C    Hypertension    There are no active problems to display for this patient.  Home Medication(s) Prior to Admission medications   Medication Sig Start Date End Date Taking? Authorizing Provider  aspirin EC 81 MG tablet Take 1 tablet (81 mg total) by mouth daily. Swallow whole. 02/12/22   Lewayne Bunting, MD  cyclobenzaprine (FLEXERIL) 10 MG tablet Take 10 mg by mouth 2 (two) times daily as needed. 01/29/22   [provider]  ferrous sulfate 325 (65 FE) MG tablet Take by mouth. 06/26/21   [provider]  furosemide (LASIX) 20 MG tablet Take 1 tablet daily x 2 days and then as needed for swelling 02/12/22   Lewayne Bunting, MD  gabapentin (NEURONTIN) 300 MG capsule Take 300 mg by mouth 3 (three) times daily. 01/29/22   [provider]  ibuprofen (ADVIL) 800 MG tablet Take 1 tablet (800 mg total) by mouth every 6 (six) hours as needed for moderate pain. 01/20/22   Gilda Crease, MD  lidocaine (LIDODERM) 5 % Place 1 patch onto the skin daily. Remove & Discard patch within 12 hours or as directed by MD 04/09/23   Teressa Lower, PA-C  METHADONE HCL PO Take 40 mg by mouth 1 day or 1 dose.    [provider]  methocarbamol (ROBAXIN) 500 MG tablet Take 1 tablet (500 mg total) by mouth every 8  (eight) hours as needed for muscle spasms. 01/20/22   Gilda Crease, MD  metoprolol succinate (TOPROL XL) 50 MG 24 hr tablet Take 1 tablet (50 mg total) by mouth daily. 02/12/22   Lewayne Bunting, MD  omeprazole (PRILOSEC) 40 MG capsule Take 1 capsule by mouth daily. 01/05/20   [provider]  predniSONE (DELTASONE) 20 MG tablet Take 3 tablets by mouth daily on days 1 - 3 , then 2 tablets on days 4 - 6, then 1 tablet on days 7 - 9, then 1/2 tablet on days 10 - 12. 01/23/22   Renne Crigler, PA-C  tadalafil (CIALIS) 10 MG tablet Take by mouth as needed. 01/05/20   [provider]  Allergies Patient has no known allergies.  Review of Systems Review of Systems  Respiratory:  Negative for shortness of breath.   Cardiovascular:  Negative for chest pain.  Gastrointestinal:  Negative for abdominal pain.  Genitourinary:  Positive for flank pain.  Neurological:  Negative for headaches.   As noted in HPI  Physical Exam Vital Signs  I have reviewed the triage vital signs BP (!) 148/97 (BP Location: Right Arm)   Pulse 92   Temp 98.2 F (36.8 C) (Oral)   Resp 20   Ht 5' 9.5" (1.765 m)   Wt 91.6 kg   SpO2 97%   BMI 29.40 kg/m   Physical Exam Vitals reviewed.  Constitutional:      General: He is not in acute distress.    Appearance: He is well-developed. He is not diaphoretic.  HENT:     Head: Normocephalic and atraumatic.     Right Ear: External ear normal.     Left Ear: External ear normal.     Nose: Nose normal.     Mouth/Throat:     Mouth: Mucous membranes are moist.  Eyes:     General: No scleral icterus.    Conjunctiva/sclera: Conjunctivae normal.  Neck:     Trachea: Phonation normal.  Cardiovascular:     Rate and Rhythm: Normal rate and regular rhythm.  Pulmonary:     Effort: Pulmonary effort is normal. No respiratory  distress.     Breath sounds: No stridor.  Abdominal:     General: There is no distension.     Tenderness: There is abdominal tenderness in the right upper quadrant. There is no guarding or rebound. Negative signs include Murphy's sign and McBurney's sign.  Musculoskeletal:        General: Normal range of motion.     Cervical back: Normal range of motion.  Neurological:     Mental Status: He is alert and oriented to person, place, and time.  Psychiatric:        Behavior: Behavior normal.     ED Results and Treatments Labs (all labs ordered are listed, but only abnormal results are displayed) Labs Reviewed  COMPREHENSIVE METABOLIC PANEL - Abnormal; Notable for the following components:      Result Value   Glucose, Bld 131 (*)    Calcium 8.6 (*)    All other components within normal limits  CBC WITH DIFFERENTIAL/PLATELET - Abnormal; Notable for the following components:   WBC 10.8 (*)    All other components within normal limits  URINALYSIS, ROUTINE W REFLEX MICROSCOPIC - Abnormal; Notable for the following components:   Hgb urine dipstick TRACE (*)    All other components within normal limits  URINALYSIS, MICROSCOPIC (REFLEX) - Abnormal; Notable for the following components:   Bacteria, UA FEW (*)    All other components within normal limits  LIPASE, BLOOD  EKG  EKG Interpretation Date/Time:    Ventricular Rate:    PR Interval:    QRS Duration:    QT Interval:    QTC Calculation:   R Axis:      Text Interpretation:         Radiology CT ABDOMEN PELVIS W CONTRAST Result Date: 09/08/2023 CLINICAL DATA:  67 year old male with right abdominal pain. Increasing flank, right lower quadrant pain for 1 week. EXAM: CT ABDOMEN AND PELVIS WITH CONTRAST TECHNIQUE: Multidetector CT imaging of the abdomen and pelvis was performed using the standard protocol  following bolus administration of intravenous contrast. RADIATION DOSE REDUCTION: This exam was performed according to the departmental dose-optimization program which includes automated exposure control, adjustment of the mA and/or kV according to patient size and/or use of iterative reconstruction technique. CONTRAST:  OMNIPAQUE IOHEXOL 300 MG/ML  SOLN COMPARISON:  Report of chest CTA 02/12/2022 (no images available). FINDINGS: Lower chest: Possible small gastric hiatal hernia versus phrenic ampulla. Normal heart size. No pericardial or pleural effusion. Lung bases are negative. Hepatobiliary: Abnormal gallbladder pericholecystic edema or wall thickening. See series 301, image 29 and coronal image 98. Evidence of cholelithiasis. Subtle hyperenhancement of the liver parenchyma at the gallbladder fossa. Otherwise negative liver. No bile duct enlargement. Pancreas: Partially fatty atrophied, otherwise negative. Spleen: Negative. Adrenals/Urinary Tract: Negative; simple fluid density right renal midpole cysts (no follow-up imaging recommended). Symmetric renal enhancement and contrast excretion. Stomach/Bowel: Redundant sigmoid colon. Retained stool in the descending and sigmoid colon. Negative transverse colon. Negative right colon and appendix (coronal image 90). Decompressed terminal ileum. No dilated small bowel. Decompressed stomach. Negative duodenum. No free air, free fluid, mesenteric inflammation identified. Vascular/Lymphatic: Calcified aortic atherosclerosis. Normal caliber abdominal aorta. Major arterial structures are patent. Portal venous system is patent. No lymphadenopathy. Reproductive: Questionable small fat containing left inguinal hernia, otherwise negative. Other: No pelvis free fluid. Musculoskeletal: Negative. IMPRESSION: 1. Constellation of CT findings suspicious for Acute Cholecystitis, underlying cholelithiasis. If the clinical course is equivocal then Right Upper Quadrant Ultrasound  would be valuable. 2. Normal appendix and no other acute or inflammatory process identified in the abdomen or pelvis. 3.  Aortic Atherosclerosis (ICD10-I70.0). Electronically Signed   By: Odessa Fleming M.D.   On: 09/08/2023 06:08    Medications Ordered in ED Medications  iohexol (OMNIPAQUE) 300 MG/ML solution 100 mL (100 mLs Intravenous Contrast Given 09/08/23 0537)   Procedures Ultrasound ED Abd  Date/Time: 09/08/2023 6:04 AM  Performed by: Nira Conn, MD Authorized by: Nira Conn, MD   Procedure details:    Indications: abdominal pain     Assessment for:  Gallstones   Hepatobiliary:  Visualized        Hepatobiliary findings:    Common bile duct:  Unable to visualize   Gallbladder wall:  Normal   Gallbladder stones: not identified     Intra-abdominal fluid: identified     Sonographic Murphy's sign: positive     (including critical care time) Medical Decision Making / ED Course   Medical Decision Making Amount and/or Complexity of Data Reviewed Labs: ordered. Radiology: ordered.  Risk Prescription drug management.    Differential diagnosis considered  Right upper quadrant/flank pain  CBC with leukocytosis.  No anemia. CMP without significant electrolyte derangements or renal sufficiency.  No evidence of bili obstruction or pancreatitis. UA without evidence of infection. Bedside ultrasound notable for mild pericholecystic fluid.  No gallbladder wall thickening or obvious stones however image was obscured due to rib shadowing CT  scan obtain given the lack of ultrasound here [employer]. Notable for pericholecystic fluid and mild gallbladder wall thickening with cholelithiasis concerning for acute cholecystitis.  Upon reevaluation of the patient to inform him of the findings, he stated that the pain had completely subsided.  On reexamination, patient had completely benign abdomen with no tenderness. No medications were given prior to  this.  Offered to obtain an ultrasound later on in the morning when the ultrasound tech would arrive but patient declined.  With shared decision making, patient opted to be discharged home with strict return precautions instead of further evaluation.      Final Clinical Impression(s) / ED Diagnoses Final diagnoses:  RUQ pain  Gallstones   The patient appears reasonably screened and/or stabilized for discharge and I doubt any other medical condition or other [facility] requiring further screening, evaluation, or treatment in the ED at this time. I have discussed the findings, Dx and Tx plan with the patient/family who expressed understanding and agree(s) with the plan. Discharge instructions discussed at length. The patient/family was given strict return precautions who verbalized understanding of the instructions. No further questions at time of discharge.  Disposition: Discharge  Condition: Good  ED Discharge Orders     None        Follow Up: [facility] Emergency Department at [facility] [address] [place] [address] [phone]  if symptoms do not improve or  worsen  Primary care provider  Call  to schedule an appointment for close follow up    This chart was dictated using voice recognition software.  Despite best efforts to proofread,  errors can occur which can change the documentation meaning.    Nira Conn, MD 09/08/23 [phone]

## 2023-09-08 NOTE — ED Notes (Signed)
Patient transported to CT
